# Patient Record
Sex: Male | Born: 1965 | Race: White | Hispanic: No | Marital: Married | Smoking: Never smoker
Health system: Southern US, Community
[De-identification: ages and names within clinical notes are randomized; demographics above are authoritative.]

## PROBLEM LIST (undated history)

## (undated) DIAGNOSIS — J45909 Unspecified asthma, uncomplicated: Secondary | ICD-10-CM

## (undated) DIAGNOSIS — C801 Malignant (primary) neoplasm, unspecified: Secondary | ICD-10-CM

## (undated) DIAGNOSIS — F419 Anxiety disorder, unspecified: Secondary | ICD-10-CM

## (undated) DIAGNOSIS — T7840XA Allergy, unspecified, initial encounter: Secondary | ICD-10-CM

## (undated) DIAGNOSIS — G43909 Migraine, unspecified, not intractable, without status migrainosus: Secondary | ICD-10-CM

## (undated) HISTORY — PX: SKIN CANCER EXCISION: SHX779

## (undated) HISTORY — DX: Anxiety disorder, unspecified: F41.9

## (undated) HISTORY — PX: KNEE SURGERY: SHX244

## (undated) HISTORY — DX: Unspecified asthma, uncomplicated: J45.909

## (undated) HISTORY — DX: Allergy, unspecified, initial encounter: T78.40XA

## (undated) HISTORY — PX: POLYPECTOMY: SHX149

## (undated) HISTORY — PX: OTHER SURGICAL HISTORY: SHX169

## (undated) HISTORY — DX: Migraine, unspecified, not intractable, without status migrainosus: G43.909

## (undated) HISTORY — DX: Malignant (primary) neoplasm, unspecified: C80.1

---

## 2002-04-16 HISTORY — PX: SHOULDER ARTHROSCOPY W/ LABRAL REPAIR: SHX2399

## 2008-04-16 HISTORY — PX: VASECTOMY: SHX75

## 2008-12-08 ENCOUNTER — Ambulatory Visit: Payer: Self-pay | Admitting: Sports Medicine

## 2008-12-08 ENCOUNTER — Encounter: Admission: RE | Admit: 2008-12-08 | Discharge: 2008-12-08 | Payer: Self-pay | Admitting: Sports Medicine

## 2008-12-08 DIAGNOSIS — M79609 Pain in unspecified limb: Secondary | ICD-10-CM

## 2009-08-26 ENCOUNTER — Ambulatory Visit: Payer: Self-pay | Admitting: Family Medicine

## 2009-08-26 DIAGNOSIS — R51 Headache: Secondary | ICD-10-CM

## 2009-08-26 DIAGNOSIS — M7650 Patellar tendinitis, unspecified knee: Secondary | ICD-10-CM

## 2009-08-26 DIAGNOSIS — J45909 Unspecified asthma, uncomplicated: Secondary | ICD-10-CM | POA: Insufficient documentation

## 2009-08-26 DIAGNOSIS — G43909 Migraine, unspecified, not intractable, without status migrainosus: Secondary | ICD-10-CM

## 2009-08-26 DIAGNOSIS — R519 Headache, unspecified: Secondary | ICD-10-CM | POA: Insufficient documentation

## 2009-08-26 DIAGNOSIS — J309 Allergic rhinitis, unspecified: Secondary | ICD-10-CM | POA: Insufficient documentation

## 2009-08-26 HISTORY — DX: Migraine, unspecified, not intractable, without status migrainosus: G43.909

## 2009-08-26 HISTORY — DX: Unspecified asthma, uncomplicated: J45.909

## 2009-08-30 ENCOUNTER — Ambulatory Visit: Payer: Self-pay | Admitting: Family Medicine

## 2009-09-30 ENCOUNTER — Ambulatory Visit: Payer: Self-pay | Admitting: Family Medicine

## 2009-09-30 LAB — CONVERTED CEMR LAB
ALT: 16 units/L (ref 0–53)
Basophils Absolute: 0 10*3/uL (ref 0.0–0.1)
Basophils Relative: 0.3 % (ref 0.0–3.0)
CO2: 31 meq/L (ref 19–32)
Calcium: 9.1 mg/dL (ref 8.4–10.5)
Chloride: 104 meq/L (ref 96–112)
Direct LDL: 150.8 mg/dL
Eosinophils Relative: 2.9 % (ref 0.0–5.0)
GFR calc non Af Amer: 87.42 mL/min (ref >60)
Glucose, Urine, Semiquant: NEGATIVE
HCT: 43.9 % (ref 39.0–52.0)
Hemoglobin: 14.6 g/dL (ref 13.0–17.0)
Ketones, urine, test strip: NEGATIVE
MCHC: 33.4 g/dL (ref 30.0–36.0)
Monocytes Absolute: 0.8 10*3/uL (ref 0.1–1.0)
Monocytes Relative: 10.5 % (ref 3.0–12.0)
Neutro Abs: 4.8 10*3/uL (ref 1.4–7.7)
Neutrophils Relative %: 64.8 % (ref 43.0–77.0)
Nitrite: NEGATIVE
Platelets: 189 10*3/uL (ref 150.0–400.0)
Protein, U semiquant: NEGATIVE
RBC: 4.82 M/uL (ref 4.22–5.81)
Specific Gravity, Urine: 1.015
Total CHOL/HDL Ratio: 5
Total Protein: 7.1 g/dL (ref 6.0–8.3)
Triglycerides: 128 mg/dL (ref 0.0–149.0)
VLDL: 25.6 mg/dL (ref 0.0–40.0)
WBC Urine, dipstick: NEGATIVE

## 2009-10-07 ENCOUNTER — Ambulatory Visit: Payer: Self-pay | Admitting: Family Medicine

## 2010-05-16 NOTE — Assessment & Plan Note (Signed)
Summary: CPX/NJR   Vital Signs:  Patient profile:   45 year old male Height:      73.25 inches Weight:      198 pounds Temp:     98.4 degrees F oral Pulse rate:   60 / minute Pulse rhythm:   regular Resp:     12 per minute BP sitting:   102 / 70  (left arm) Cuff size:   regular  Vitals Entered By: Sid Falcon LPN (October 07, 2009 11:35 AM) CC: CPX   History of Present Illness: Here for CPE.  tetanus last year.  Pos FH of prostate ca in father and MGF. Exercises regularly. Generally feels well.   Allergies (verified): No Known Drug Allergies  Past History:  Past Medical History: Last updated: 08/26/2009 Allergies, hayfever Asthma, as a child Headache/Migraines  Family History: Last updated: 08/26/2009 Family History of Arthritis Family History of Prostate CA, father and grandfather Lupus, Kidney diseasematernal grandmother  Social History: Last updated: 08/26/2009 Occupation:  Designer, jewellery Married Never Smoked Alcohol use-yes Regular exercise-yes  Risk Factors: Exercise: yes (08/26/2009)  Risk Factors: Smoking Status: never (08/26/2009)  Past Surgical History: Shoiulder repair labrum tear 2004 Vasecotomy 2010 Peridontal surgery 2009-2010 PMH-FH-SH reviewed for relevance  Review of Systems  The patient denies anorexia, fever, weight loss, weight gain, vision loss, decreased hearing, hoarseness, chest pain, syncope, dyspnea on exertion, peripheral edema, prolonged cough, headaches, hemoptysis, abdominal pain, melena, hematochezia, severe indigestion/heartburn, hematuria, incontinence, genital sores, muscle weakness, suspicious skin lesions, transient blindness, difficulty walking, depression, unusual weight change, abnormal bleeding, enlarged lymph nodes, and testicular masses.    Physical Exam  General:  Well-developed,well-nourished,in no acute distress; alert,appropriate and cooperative throughout examination Eyes:  No corneal or conjunctival  inflammation noted. EOMI. Perrla. Funduscopic exam benign, without hemorrhages, exudates or papilledema. Vision grossly normal. Ears:  External ear exam shows no significant lesions or deformities.  Otoscopic examination reveals clear canals, tympanic membranes are intact bilaterally without bulging, retraction, inflammation or discharge. Hearing is grossly normal bilaterally. Mouth:  Oral mucosa and oropharynx without lesions or exudates.  Teeth in good repair. Neck:  No deformities, masses, or tenderness noted. Lungs:  Normal respiratory effort, chest expands symmetrically. Lungs are clear to auscultation, no crackles or wheezes. Heart:  Normal rate and regular rhythm. S1 and S2 normal without gallop, murmur, click, rub or other extra sounds. Abdomen:  Bowel sounds positive,abdomen soft and non-tender without masses, organomegaly or hernias noted. Rectal:  No external abnormalities noted. Normal sphincter tone. No rectal masses or tenderness. Prostate:  Prostate gland firm and smooth, no enlargement, nodularity, tenderness, mass, asymmetry or induration. Extremities:  abrasion r elbow. Mild tender r patellar tendon. Neurologic:  No cranial nerve deficits noted. Station and gait are normal. Plantar reflexes are down-going bilaterally. DTRs are symmetrical throughout. Sensory, motor and coordinative functions appear intact. Skin:  no rashes and no suspicious lesions.   Cervical Nodes:  No lymphadenopathy noted Psych:  Cognition and judgment appear intact. Alert and cooperative with normal attention span and concentration. No apparent delusions, illusions, hallucinations   Impression & Recommendations:  Problem # 1:  Preventive Health Care (ICD-V70.0) add PSA with FH.  Other labs reviewed and no signif abnormality.  cont regulare exercise.  Complete Medication List: 1)  Imitrex 100 Mg Tabs (Sumatriptan succinate) .... One at onset of migraine and may repeat one in 2 hours as needed max 2 in 24  hours.  Other Orders: TLB-PSA (Prostate Specific Antigen) (84153-PSA)  Anticoagulation Management Assessment/Plan:  Patient Instructions: 1)  Continue to ice knee after exercise. 2)  Pennsaid 10 drops to knee three times daily.   Preventive Care Screening  Last Tetanus Booster:    Date:  08/17/2008    Results:  Tdap

## 2010-05-16 NOTE — Assessment & Plan Note (Signed)
Summary: NEW PT TO [facility]/TO EST/CJR   Vital Signs:  Patient profile:   45 year old male Height:      73.25 inches Weight:      197 pounds BMI:     25.91 Temp:     98.0 degrees F oral Pulse rate:   60 / minute Pulse rhythm:   regular Resp:     12 per minute BP sitting:   120 / 78  (left arm) Cuff size:   regular  Vitals Entered By: Sid Falcon LPN (Aug 26, 2009 11:25 AM)  Nutrition Counseling: Patient's BMI is greater than 25 and therefore counseled on weight management options.  History of Present Illness: New patient to establish care.  Past history migraine headaches. Recently using Excedrin which helps but usually has to take several tablets. Previously treated with Imitrex which worked well. Also prior history of mildly elevated lipids.  Acute problem bilateral anterior knee pain below the patella left greater than right. Plays tennis frequently. No weakness. No effusion. No injury.  Acute problem 2 week history right lower leg soreness. Noticed a swollen area just above the ankle. No injury. No limp with walking. No appetite or weight changes.  Past medical history reviewed. Surgeries as indicated. Family history signif for mother osteoarthritis. Father had prostate cancer. Grandmother with lupus  Patient married. Nonsmoker. Business executive. Occasional alcohol use.  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 Alcohol use-yes Regular exercise-yes  Risk Factors: Exercise: yes (08/26/2009)  Risk Factors: Smoking Status: never  (08/26/2009)  Past Medical History: Allergies, hayfever Asthma, as a child Headache/Migraines  Past Surgical History: Shoiulder repain 2004 Vasecotomy 2010 Peridontal surgery 2009-2010  Family History: Family History of Arthritis Family History of Prostate CA, father and grandfather Lupus, Kidney diseasematernal grandmother  Social History: Occupation:  Designer, 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    Physical Exam  General:  Well-developed,well-nourished,in no acute distress; alert,appropriate and cooperative throughout examination Head:  Normocephalic and atraumatic without obvious abnormalities. No apparent alopecia or balding. Ears:  External ear exam shows no significant lesions or deformities.  Otoscopic examination reveals clear canals, tympanic membranes are intact bilaterally without bulging, retraction, inflammation or discharge. Hearing is grossly normal bilaterally. Mouth:  Oral mucosa and oropharynx without lesions or exudates.  Teeth in good repair. Neck:  No deformities, masses, or tenderness noted. Lungs:  Normal respiratory effort, chest expands symmetrically. Lungs are clear to auscultation, no crackles or wheezes. Heart:  Normal rate and regular rhythm. S1 and S2 normal without gallop, murmur, click, rub or other extra sounds. Abdomen:  Bowel sounds positive,abdomen soft and non-tender without masses, organomegaly or hernias noted. Extremities:  right lower leg about 3 cm above the medial malleolus he has area of swelling which is firm to palpation and appears to be continuous with the  bone approx 2 cm area. No  overlying skin changes. No soft tissue swelling. Minimally tender to palpation. No right inguinal adenopathy Neurologic:  full strength lower extremities.   Impression & Recommendations:  Problem # 1:  MIGRAINE HEADACHE (ICD-346.90)  refill Imitrex which has worked well in the past  His updated medication list for this problem includes:    Imitrex 100 Mg Tabs (Sumatriptan succinate) ..... One at onset of migraine and may repeat one in 2 hours as needed max 2 in 24 hours.  Problem # 2:  LEG PAIN (ICD-729.5)  patient has bony prominence right leg. Start with plain x-rays.  Orders: T-Tibia Right (73590TC)  Problem # 3:  PATELLAR TENDINITIS (ICD-726.64) regular icing and discussed use of patellar tendon strap.  Complete Medication List: 1)  Imitrex 100 Mg Tabs (Sumatriptan succinate) .... One at onset of migraine and may repeat one in 2 hours as needed max 2 in 24 hours.  Patient Instructions: 1)  schedule complete physical examination Prescriptions: IMITREX 100 MG TABS (SUMATRIPTAN SUCCINATE) one at onset of migraine and may repeat one in 2 hours as needed max 2 in 24 hours.  #9 x 11   Entered and Authorized by:   Evelena Peat MD   Signed by:   Evelena Peat MD on 08/26/2009   Method used:   Electronically to        [facility]  [place] [address] (retail)       [address]       [place], [place]  [address]       Ph: [phone] or [phone]       Fax: [phone]   RxID:   [id number]

## 2010-05-26 ENCOUNTER — Encounter: Payer: Self-pay | Admitting: Family Medicine

## 2010-05-26 ENCOUNTER — Ambulatory Visit (INDEPENDENT_AMBULATORY_CARE_PROVIDER_SITE_OTHER): Payer: 59 | Admitting: Family Medicine

## 2010-05-26 VITALS — BP 110/80 | Temp 98.0°F | Ht 73.25 in | Wt 206.0 lb

## 2010-05-26 DIAGNOSIS — J209 Acute bronchitis, unspecified: Secondary | ICD-10-CM

## 2010-05-26 MED ORDER — AZITHROMYCIN 250 MG PO TABS: ORAL_TABLET | ORAL | Status: AC

## 2010-05-26 NOTE — Patient Instructions (Signed)
Drink plenty of fluids Consider mucinex 2 twice daily. Be in touch in one to two weeks if no better.

## 2010-05-26 NOTE — Progress Notes (Signed)
  Subjective:    Patient ID: Cody Arias, male    DOB: October 27, 1965, 45 y.o.   MRN: [id number]  HPI  Patient is seen with decreased voice and laryngitis symptoms off and on for one month. Particularly worse in the past few days. Dry cough. No fever. Denies GERD symptoms. No facial pain. Has recently had some cough productive of brown to green sputum. No hemoptysis. Nonsmoker. Denies dyspnea.   Review of Systems  as per history of present illness    Objective:   Physical Exam     Patient is alert and healthy in appearance  Oropharynx is moist and clear  Neck is supple no adenopathy  Chest clear to auscultation  Heart regular rhythm and rate with no murmur    Assessment & Plan:   Acute laryngitis/bronchitis   Possible viral etiology but given duration of symptoms start Zithromax. Plenty of fluids and Mucinex 2 tablets twice daily. Consider ENT referral for a better one to 2 weeks

## 2010-08-07 ENCOUNTER — Encounter: Payer: Self-pay | Admitting: *Deleted

## 2013-05-29 ENCOUNTER — Encounter: Payer: Self-pay | Admitting: Family Medicine

## 2013-05-29 ENCOUNTER — Ambulatory Visit (INDEPENDENT_AMBULATORY_CARE_PROVIDER_SITE_OTHER): Payer: BC Managed Care – PPO | Admitting: Family Medicine

## 2013-05-29 VITALS — BP 118/72 | HR 91 | Temp 97.5°F | Ht 73.25 in | Wt 190.0 lb

## 2013-05-29 DIAGNOSIS — R7989 Other specified abnormal findings of blood chemistry: Secondary | ICD-10-CM

## 2013-05-29 DIAGNOSIS — F524 Premature ejaculation: Secondary | ICD-10-CM

## 2013-05-29 DIAGNOSIS — Z0000 Encounter for general adult medical examination without abnormal findings: Secondary | ICD-10-CM

## 2013-05-29 DIAGNOSIS — F418 Other specified anxiety disorders: Secondary | ICD-10-CM

## 2013-05-29 DIAGNOSIS — F411 Generalized anxiety disorder: Secondary | ICD-10-CM

## 2013-05-29 LAB — TSH: TSH: 1.98 u[IU]/mL (ref 0.35–5.50)

## 2013-05-29 LAB — CBC WITH DIFFERENTIAL/PLATELET
BASOS ABS: 0 10*3/uL (ref 0.0–0.1)
Basophils Relative: 0.6 % (ref 0.0–3.0)
EOS ABS: 0.3 10*3/uL (ref 0.0–0.7)
EOS PCT: 4.8 % (ref 0.0–5.0)
HCT: 46 % (ref 39.0–52.0)
Hemoglobin: 14.9 g/dL (ref 13.0–17.0)
LYMPHS ABS: 1.5 10*3/uL (ref 0.7–4.0)
Lymphocytes Relative: 23 % (ref 12.0–46.0)
MCHC: 32.5 g/dL (ref 30.0–36.0)
MCV: 92.4 fl (ref 78.0–100.0)
Monocytes Absolute: 0.7 10*3/uL (ref 0.1–1.0)
Monocytes Relative: 11.3 % (ref 3.0–12.0)
Neutro Abs: 3.9 10*3/uL (ref 1.4–7.7)
Neutrophils Relative %: 60.3 % (ref 43.0–77.0)
Platelets: 206 10*3/uL (ref 150.0–400.0)
RBC: 4.98 Mil/uL (ref 4.22–5.81)
RDW: 14.7 % — ABNORMAL HIGH (ref 11.5–14.6)
WBC: 6.5 10*3/uL (ref 4.5–10.5)

## 2013-05-29 LAB — HEPATIC FUNCTION PANEL
ALBUMIN: 4 g/dL (ref 3.5–5.2)
ALT: 18 U/L (ref 0–53)
AST: 26 U/L (ref 0–37)
Alkaline Phosphatase: 38 U/L — ABNORMAL LOW (ref 39–117)
Bilirubin, Direct: 0.1 mg/dL (ref 0.0–0.3)
TOTAL PROTEIN: 6.9 g/dL (ref 6.0–8.3)
Total Bilirubin: 1.4 mg/dL — ABNORMAL HIGH (ref 0.3–1.2)

## 2013-05-29 LAB — LIPID PANEL
Cholesterol: 206 mg/dL — ABNORMAL HIGH (ref 0–200)
HDL: 56.7 mg/dL (ref >39.00)
TRIGLYCERIDES: 78 mg/dL (ref 0.0–149.0)
Total CHOL/HDL Ratio: 4
VLDL: 15.6 mg/dL (ref 0.0–40.0)

## 2013-05-29 LAB — BASIC METABOLIC PANEL
BUN: 17 mg/dL (ref 6–23)
CALCIUM: 9.8 mg/dL (ref 8.4–10.5)
CHLORIDE: 104 meq/L (ref 96–112)
CO2: 31 meq/L (ref 19–32)
Creatinine, Ser: 1 mg/dL (ref 0.4–1.5)
GFR: 80.37 mL/min (ref >60.00)
Glucose, Bld: 77 mg/dL (ref 70–99)
Potassium: 5.7 mEq/L — ABNORMAL HIGH (ref 3.5–5.1)
Sodium: 141 mEq/L (ref 135–145)

## 2013-05-29 LAB — LDL CHOLESTEROL, DIRECT: LDL DIRECT: 140.9 mg/dL

## 2013-05-29 LAB — PSA: PSA: 0.6 ng/mL (ref 0.10–4.00)

## 2013-05-29 MED ORDER — PROPRANOLOL HCL 10 MG PO TABS: ORAL_TABLET | ORAL | Status: DC

## 2013-05-29 MED ORDER — FLUOXETINE HCL 20 MG PO TABS: 20.0000 mg | ORAL_TABLET | Freq: Every day | ORAL | Status: DC

## 2013-05-29 NOTE — Patient Instructions (Addendum)
We will call you with colonoscopy referral.   Propranolol take one about one hour prior to presentation.

## 2013-05-29 NOTE — Progress Notes (Signed)
   Subjective:    Patient ID: Cody Arias, male    DOB: 1965/08/14, 48 y.o.   MRN: [id number]  HPI Patient here for complete physical. Generally very healthy. He takes no regular medications. Rare migraines and takes Imitrex for that. He will turn 48 later this year. Exercises almost daily frequently runs 3-4 miles per day. His father had prostate cancer. He has not been screened a few years. Tetanus is up-to-date.  He has acute issues of occasional premature ejaculation. No history of ED. He would like to explore possible treatment options Patient also has history of performance anxiety with presentations. He frequently has difficulties with voice and tremors. He would like to explore possible options. He does not have any active asthma issues.  Past Medical History  Diagnosis Date  . MIGRAINE HEADACHE 08/26/2009  . ASTHMA 08/26/2009  . Allergy     hayfever   Past Surgical History  Procedure Laterality Date  . Shoulder arthroscopy w/ labral repair  2004  . Vasectomy  2010  . Peridontal  2009, 2010    reports that he has never smoked. He does not have any smokeless tobacco history on file. He reports that he drinks alcohol. His drug history is not on file. family history includes Cancer in his father and paternal grandfather; Kidney disease in his maternal grandmother; Lupus in his maternal grandfather. No Known Allergies    Review of Systems  Constitutional: Negative for fever, activity change, appetite change and fatigue.  HENT: Negative for congestion, ear pain and trouble swallowing.   Eyes: Negative for pain and visual disturbance.  Respiratory: Negative for cough, shortness of breath and wheezing.   Cardiovascular: Negative for chest pain and palpitations.  Gastrointestinal: Negative for nausea, vomiting, abdominal pain, diarrhea, constipation, blood in stool, abdominal distention and rectal pain.  Genitourinary: Negative for dysuria, hematuria and testicular pain.    Musculoskeletal: Negative for arthralgias and joint swelling.  Skin: Negative for rash.  Neurological: Negative for dizziness, syncope and headaches.  Hematological: Negative for adenopathy.  Psychiatric/Behavioral: Negative for confusion and dysphoric mood.       Objective:   Physical Exam  Constitutional: He is oriented to person, place, and time. He appears well-developed and well-nourished. No distress.  HENT:  Head: Normocephalic and atraumatic.  Right Ear: External ear normal.  Left Ear: External ear normal.  Mouth/Throat: Oropharynx is clear and moist.  Eyes: Conjunctivae and EOM are normal. Pupils are equal, round, and reactive to light.  Neck: Normal range of motion. Neck supple. No thyromegaly present.  Cardiovascular: Normal rate, regular rhythm and normal heart sounds.   No murmur heard. Pulmonary/Chest: No respiratory distress. He has no wheezes. He has no rales.  Abdominal: Soft. Bowel sounds are normal. He exhibits no distension and no mass. There is no tenderness. There is no rebound and no guarding.  Genitourinary: Rectum normal and prostate normal.  Musculoskeletal: He exhibits no edema.  Lymphadenopathy:    He has no cervical adenopathy.  Neurological: He is alert and oriented to person, place, and time. He displays normal reflexes. No cranial nerve deficit.  Skin: No rash noted.  Psychiatric: He has a normal mood and affect.          Assessment & Plan:  #1 complete physical. Screening labs obtained including PSA. Set up screening colonoscopy #2 premature ejaculation. Trial of Prozac 20 mg daily. Review possible side effects #3 performance anxiety. Propranolol 10 mg take one hour prior to presentations

## 2013-05-29 NOTE — Progress Notes (Signed)
Pre visit review using our clinic review tool, if applicable. No additional management support is needed unless otherwise documented below in the visit note. 

## 2013-06-04 ENCOUNTER — Encounter: Payer: Self-pay | Admitting: Internal Medicine

## 2013-07-01 ENCOUNTER — Ambulatory Visit (AMBULATORY_SURGERY_CENTER): Payer: Self-pay | Admitting: *Deleted

## 2013-07-01 VITALS — Ht 74.0 in | Wt 180.0 lb

## 2013-07-01 DIAGNOSIS — Z1211 Encounter for screening for malignant neoplasm of colon: Secondary | ICD-10-CM

## 2013-07-01 MED ORDER — MOVIPREP 100 G PO SOLR
ORAL | Status: DC
Start: 2013-07-01 — End: 2013-07-15

## 2013-07-01 NOTE — Progress Notes (Signed)
Patient denies any allergies to eggs or soy. Patient denies any problems with anesthesia.  

## 2013-07-15 ENCOUNTER — Ambulatory Visit (AMBULATORY_SURGERY_CENTER): Payer: BC Managed Care – PPO | Admitting: Internal Medicine

## 2013-07-15 ENCOUNTER — Encounter: Payer: Self-pay | Admitting: Internal Medicine

## 2013-07-15 VITALS — BP 138/77 | HR 54 | Temp 97.8°F | Resp 12 | Ht 74.0 in | Wt 180.0 lb

## 2013-07-15 DIAGNOSIS — D126 Benign neoplasm of colon, unspecified: Secondary | ICD-10-CM

## 2013-07-15 DIAGNOSIS — Z1211 Encounter for screening for malignant neoplasm of colon: Secondary | ICD-10-CM

## 2013-07-15 HISTORY — PX: COLONOSCOPY: SHX174

## 2013-07-15 MED ORDER — SODIUM CHLORIDE 0.9 % IV SOLN: 500.0000 mL | INTRAVENOUS | Status: DC

## 2013-07-15 NOTE — Progress Notes (Signed)
Report to pacu rn, vss, bbs=clear 

## 2013-07-15 NOTE — Progress Notes (Signed)
Patient denies any allergies to eggs or soy. Patient denies any problems with anesthesia.  

## 2013-07-15 NOTE — Patient Instructions (Signed)
YOU HAD AN ENDOSCOPIC PROCEDURE TODAY AT THE [facility]: Refer to the procedure report that was given to you for any specific questions about what was found during the examination.  If the procedure report does not answer your questions, please call your gastroenterologist to clarify.  If you requested that your care partner not be given the details of your procedure findings, then the procedure report has been included in a sealed envelope for you to review at your convenience later.  YOU SHOULD EXPECT: Some feelings of bloating in the abdomen. Passage of more gas than usual.  Walking can help get rid of the air that was put into your GI tract during the procedure and reduce the bloating. If you had a lower endoscopy (such as a colonoscopy or flexible sigmoidoscopy) you may notice spotting of blood in your stool or on the toilet paper. If you underwent a bowel prep for your procedure, then you may not have a normal bowel movement for a few days.  DIET: Your first meal following the procedure should be a light meal and then it is ok to progress to your normal diet.  A half-sandwich or bowl of soup is an example of a good first meal.  Heavy or fried foods are harder to digest and may make you feel nauseous or bloated.  Likewise meals heavy in dairy and vegetables can cause extra gas to form and this can also increase the bloating.  Drink plenty of fluids but you should avoid alcoholic beverages for 24 hours.  ACTIVITY: Your care partner should take you home directly after the procedure.  You should plan to take it easy, moving slowly for the rest of the day.  You can resume normal activity the day after the procedure however you should NOT DRIVE or use heavy machinery for 24 hours (because of the sedation medicines used during the test).    SYMPTOMS TO REPORT IMMEDIATELY: A gastroenterologist can be reached at any hour.  During normal business hours, 8:30 AM to 5:00 PM Monday through Friday,  call [phone].  After hours and on weekends, please call the GI answering service at [phone] who will take a message and have the physician on call contact you.   Following lower endoscopy (colonoscopy or flexible sigmoidoscopy):  Excessive amounts of blood in the stool  Significant tenderness or worsening of abdominal pains  Swelling of the abdomen that is new, acute  Fever of 100F or higher  FOLLOW UP: If any biopsies were taken you will be contacted by phone or by letter within the next 1-3 weeks.  Call your gastroenterologist if you have not heard about the biopsies in 3 weeks.  Our staff will call the home number listed on your records the next business day following your procedure to check on you and address any questions or concerns that you may have at that time regarding the information given to you following your procedure. This is a courtesy call and so if there is no answer at the home number and we have not heard from you through the emergency physician on call, we will assume that you have returned to your regular daily activities without incident.  SIGNATURES/CONFIDENTIALITY: You and/or your care partner have signed paperwork which will be entered into your electronic medical record.  These signatures attest to the fact that that the information above on your After Visit Summary has been reviewed and is understood.  Full responsibility of the confidentiality of this   discharge information lies with you and/or your care-partner.  Continue your normal medications  Please read over handouts about polyps,  Diverticulosis and high fiber diets  Await pathology

## 2013-07-15 NOTE — Progress Notes (Signed)
Called to room to assist during endoscopic procedure.  Patient ID and intended procedure confirmed with present staff. Received instructions for my participation in the procedure from the performing physician.  

## 2013-07-15 NOTE — Op Note (Signed)
[facility]  [address] [place], [address]   COLONOSCOPY PROCEDURE REPORT  PATIENT: Cody Arias, Cody Arias  MR#: [id number] BIRTHDATE: 1965-09-09 , 22  yrs. old GENDER: Male ENDOSCOPIST: Jerene Bears, MD REFERRED OV:ZCHYI Elease Hashimoto, M.D. PROCEDURE DATE:  07/15/2013 PROCEDURE:   Colonoscopy with snare polypectomy First Screening Colonoscopy - Avg.  risk and is 50 yrs.  old or older Yes.  Prior Negative Screening - Now for repeat screening. N/A  History of Adenoma - Now for follow-up colonoscopy & has been > or = to 3 yrs.  N/A  Polyps Removed Today? Yes. ASA CLASS:   Class II INDICATIONS:average risk screening and first colonoscopy. MEDICATIONS: MAC sedation, administered by CRNA and propofol (Diprivan) 350mg  IV  DESCRIPTION OF PROCEDURE:   After the risks benefits and alternatives of the procedure were thoroughly explained, informed consent was obtained.  A digital rectal exam revealed no rectal mass.   The LB [id number]  endoscope was introduced through the anus and advanced to the cecum, which was identified by both the appendix and ileocecal valve. No adverse events experienced. The quality of the prep was good, using MoviPrep  The instrument was then slowly withdrawn as the colon was fully examined.  COLON FINDINGS: Two sessile polyps measuring 3-6 mm in size were found in the ascending colon and transverse colon.  Polypectomy was performed using cold snare.  All resections were complete and all polyp tissue was completely retrieved.   There was moderate diverticulosis noted in the sigmoid colon with associated muscular hypertrophy.  Retroflexed views revealed no abnormalities. The time to cecum=7 minutes 56 seconds.  Withdrawal time=10 minutes 31 seconds.  The scope was withdrawn and the procedure completed. COMPLICATIONS: There were no complications.  ENDOSCOPIC IMPRESSION: 1.   Two sessile polyps measuring 3-6 mm in size were found in the ascending  colon and transverse colon; Polypectomy was performed using cold snare 2.   There was moderate diverticulosis noted in the sigmoid colon  RECOMMENDATIONS: 1.  Await pathology results 2.  High fiber diet 3.  If the polyps removed today are proven to be adenomatous (pre-cancerous) polyps, you will need a repeat colonoscopy in 5 years.  Otherwise you should continue to follow colorectal cancer screening guidelines for "routine risk" patients with colonoscopy in 10 years.  You will receive a letter within 1-2 weeks with the results of your biopsy as well as final recommendations.  Please call my office if you have not received a letter after 3 weeks.   eSigned:  Jerene Bears, MD 07/15/2013 8:51 AM      cc: The Patient and Carolann Littler, MD

## 2013-07-16 ENCOUNTER — Telehealth: Payer: Self-pay

## 2013-07-16 NOTE — Telephone Encounter (Signed)
Left message on answering machine. 

## 2013-07-26 ENCOUNTER — Encounter: Payer: Self-pay | Admitting: Internal Medicine

## 2014-06-08 ENCOUNTER — Encounter: Payer: Self-pay | Admitting: Family Medicine

## 2014-06-18 ENCOUNTER — Encounter: Payer: Self-pay | Admitting: Family Medicine

## 2014-06-18 ENCOUNTER — Ambulatory Visit (INDEPENDENT_AMBULATORY_CARE_PROVIDER_SITE_OTHER): Payer: BLUE CROSS/BLUE SHIELD | Admitting: Family Medicine

## 2014-06-18 VITALS — BP 124/70 | HR 60 | Temp 97.7°F | Ht 73.0 in | Wt 191.0 lb

## 2014-06-18 DIAGNOSIS — Z0000 Encounter for general adult medical examination without abnormal findings: Secondary | ICD-10-CM

## 2014-06-18 LAB — HEPATIC FUNCTION PANEL
ALT: 14 U/L (ref 0–53)
AST: 23 U/L (ref 0–37)
Albumin: 4.2 g/dL (ref 3.5–5.2)
Alkaline Phosphatase: 40 U/L (ref 39–117)
BILIRUBIN DIRECT: 0.2 mg/dL (ref 0.0–0.3)
Total Bilirubin: 1 mg/dL (ref 0.2–1.2)
Total Protein: 6.8 g/dL (ref 6.0–8.3)

## 2014-06-18 LAB — BASIC METABOLIC PANEL
BUN: 17 mg/dL (ref 6–23)
CO2: 33 mEq/L — ABNORMAL HIGH (ref 19–32)
CREATININE: 1.14 mg/dL (ref 0.40–1.50)
Calcium: 9.4 mg/dL (ref 8.4–10.5)
Chloride: 103 mEq/L (ref 96–112)
GFR: 71.98 mL/min (ref >60.00)
GLUCOSE: 80 mg/dL (ref 70–99)
POTASSIUM: 5.2 meq/L — AB (ref 3.5–5.1)
Sodium: 138 mEq/L (ref 135–145)

## 2014-06-18 LAB — LIPID PANEL
CHOLESTEROL: 207 mg/dL — AB (ref 0–200)
HDL: 54.7 mg/dL (ref >39.00)
LDL Cholesterol: 133 mg/dL — ABNORMAL HIGH (ref 0–99)
NonHDL: 152.3
TRIGLYCERIDES: 99 mg/dL (ref 0.0–149.0)
Total CHOL/HDL Ratio: 4
VLDL: 19.8 mg/dL (ref 0.0–40.0)

## 2014-06-18 LAB — CBC WITH DIFFERENTIAL/PLATELET
BASOS ABS: 0 10*3/uL (ref 0.0–0.1)
Basophils Relative: 0.6 % (ref 0.0–3.0)
EOS ABS: 0.2 10*3/uL (ref 0.0–0.7)
Eosinophils Relative: 3.4 % (ref 0.0–5.0)
HCT: 44.4 % (ref 39.0–52.0)
HEMOGLOBIN: 15 g/dL (ref 13.0–17.0)
LYMPHS PCT: 22.8 % (ref 12.0–46.0)
Lymphs Abs: 1.6 10*3/uL (ref 0.7–4.0)
MCHC: 33.8 g/dL (ref 30.0–36.0)
MCV: 88.9 fl (ref 78.0–100.0)
MONOS PCT: 10.2 % (ref 3.0–12.0)
Monocytes Absolute: 0.7 10*3/uL (ref 0.1–1.0)
NEUTROS PCT: 63 % (ref 43.0–77.0)
Neutro Abs: 4.3 10*3/uL (ref 1.4–7.7)
Platelets: 207 10*3/uL (ref 150.0–400.0)
RBC: 5 Mil/uL (ref 4.22–5.81)
RDW: 14.1 % (ref 11.5–15.5)
WBC: 6.9 10*3/uL (ref 4.0–10.5)

## 2014-06-18 LAB — TSH: TSH: 2 u[IU]/mL (ref 0.35–4.50)

## 2014-06-18 LAB — PSA: PSA: 0.56 ng/mL (ref 0.10–4.00)

## 2014-06-18 MED ORDER — SUMATRIPTAN SUCCINATE 100 MG PO TABS: 100.0000 mg | ORAL_TABLET | ORAL | Status: DC | PRN

## 2014-06-18 NOTE — Progress Notes (Signed)
Pre visit review using our clinic review tool, if applicable. No additional management support is needed unless otherwise documented below in the visit note. 

## 2014-06-18 NOTE — Progress Notes (Signed)
   Subjective:    Patient ID: Cody Arias, male    DOB: September 16, 1965, 49 y.o.   MRN: [id number]  HPI   Patient seen for complete physical. Generally very healthy. He has migraine headaches infrequently. He's had some problems with premature ejaculation. We have prescribed Prozac but he had some nausea at the 20 mg dose. He never tried taking a lower dosage. He had colonoscopy last year with 2 benign polyps. Recommended five-year follow-up. Tetanus up-to-date. He declines flu vaccine. But his father and grandfather had prostate cancer. His father died in his early 8s from prostate cancer. Patient is nonsmoker. He runs 3 miles several days per week. He has had previous history of basal cell skin cancer. He sees dermatologist yearly.  Past Medical History  Diagnosis Date  . MIGRAINE HEADACHE 08/26/2009  . Allergy     hayfever  . ASTHMA 08/26/2009    as child   Past Surgical History  Procedure Laterality Date  . Shoulder arthroscopy w/ labral repair  2004  . Vasectomy  2010  . Peridontal  2009, 2010  . Skin cancer excision      basil cell x 4 (chest,back,and leg)    reports that he has never smoked. He has never used smokeless tobacco. He reports that he drinks about 1.8 oz of alcohol per week. He reports that he does not use illicit drugs. family history includes Kidney disease in his maternal grandmother; Lupus in his maternal grandfather; Prostate cancer in his father and paternal grandfather. There is no history of Colon cancer. No Known Allergies    Review of Systems  Constitutional: Negative for fever, activity change, appetite change and fatigue.  HENT: Negative for congestion, ear pain and trouble swallowing.   Eyes: Negative for pain and visual disturbance.  Respiratory: Negative for cough, shortness of breath and wheezing.   Cardiovascular: Negative for chest pain and palpitations.  Gastrointestinal: Negative for nausea, vomiting, abdominal pain, diarrhea, constipation, blood  in stool, abdominal distention and rectal pain.  Genitourinary: Negative for dysuria, hematuria and testicular pain.  Musculoskeletal: Negative for joint swelling and arthralgias.  Skin: Negative for rash.  Neurological: Negative for dizziness, syncope and headaches.  Hematological: Negative for adenopathy.  Psychiatric/Behavioral: Negative for confusion and dysphoric mood.       Objective:   Physical Exam  Constitutional: He is oriented to person, place, and time. He appears well-developed and well-nourished. No distress.  HENT:  Head: Normocephalic and atraumatic.  Right Ear: External ear normal.  Left Ear: External ear normal.  Mouth/Throat: Oropharynx is clear and moist.  Eyes: Conjunctivae and EOM are normal. Pupils are equal, round, and reactive to light.  Neck: Normal range of motion. Neck supple. No thyromegaly present.  Cardiovascular: Normal rate, regular rhythm and normal heart sounds.   No murmur heard. Pulmonary/Chest: No respiratory distress. He has no wheezes. He has no rales.  Abdominal: Soft. Bowel sounds are normal. He exhibits no distension and no mass. There is no tenderness. There is no rebound and no guarding.  Musculoskeletal: He exhibits no edema.  Lymphadenopathy:    He has no cervical adenopathy.  Neurological: He is alert and oriented to person, place, and time. He displays normal reflexes. No cranial nerve deficit.  Skin: No rash noted.  Psychiatric: He has a normal mood and affect.          Assessment & Plan:  Complete physical. Check screening labs including PSA. Refills of Imitrex given. Repeat colonoscopy in 4 years

## 2015-06-29 ENCOUNTER — Ambulatory Visit
Admission: RE | Admit: 2015-06-29 | Discharge: 2015-06-29 | Disposition: A | Discharge status: Home / Self Care | Payer: 59 | Source: Ambulatory Visit | Attending: Chiropractic Medicine | Admitting: Chiropractic Medicine

## 2015-06-29 ENCOUNTER — Other Ambulatory Visit: Payer: Self-pay | Admitting: Chiropractic Medicine

## 2015-06-29 DIAGNOSIS — M25562 Pain in left knee: Secondary | ICD-10-CM

## 2015-07-14 DIAGNOSIS — S83209A Unspecified tear of unspecified meniscus, current injury, unspecified knee, initial encounter: Secondary | ICD-10-CM | POA: Insufficient documentation

## 2015-08-02 DIAGNOSIS — Z9889 Other specified postprocedural states: Secondary | ICD-10-CM | POA: Insufficient documentation

## 2015-10-19 ENCOUNTER — Ambulatory Visit (INDEPENDENT_AMBULATORY_CARE_PROVIDER_SITE_OTHER): Payer: 59 | Admitting: Family Medicine

## 2015-10-19 ENCOUNTER — Encounter: Payer: Self-pay | Admitting: Family Medicine

## 2015-10-19 VITALS — BP 100/70 | HR 67 | Temp 97.9°F | Resp 16 | Ht 73.0 in | Wt 194.0 lb

## 2015-10-19 DIAGNOSIS — Z0000 Encounter for general adult medical examination without abnormal findings: Secondary | ICD-10-CM | POA: Diagnosis not present

## 2015-10-19 LAB — HEPATIC FUNCTION PANEL
ALBUMIN: 3.8 g/dL (ref 3.5–5.2)
ALK PHOS: 39 U/L (ref 39–117)
ALT: 10 U/L (ref 0–53)
AST: 17 U/L (ref 0–37)
Bilirubin, Direct: 0.1 mg/dL (ref 0.0–0.3)
TOTAL PROTEIN: 6.4 g/dL (ref 6.0–8.3)
Total Bilirubin: 0.8 mg/dL (ref 0.2–1.2)

## 2015-10-19 LAB — CBC WITH DIFFERENTIAL/PLATELET
Basophils Absolute: 0 10*3/uL (ref 0.0–0.1)
Basophils Relative: 0.5 % (ref 0.0–3.0)
EOS PCT: 5.9 % — AB (ref 0.0–5.0)
Eosinophils Absolute: 0.4 10*3/uL (ref 0.0–0.7)
HCT: 41.5 % (ref 39.0–52.0)
Hemoglobin: 13.9 g/dL (ref 13.0–17.0)
LYMPHS ABS: 1.4 10*3/uL (ref 0.7–4.0)
Lymphocytes Relative: 21.5 % (ref 12.0–46.0)
MCHC: 33.4 g/dL (ref 30.0–36.0)
MCV: 89.2 fl (ref 78.0–100.0)
MONO ABS: 0.9 10*3/uL (ref 0.1–1.0)
Monocytes Relative: 13.9 % — ABNORMAL HIGH (ref 3.0–12.0)
NEUTROS ABS: 3.7 10*3/uL (ref 1.4–7.7)
NEUTROS PCT: 58.2 % (ref 43.0–77.0)
PLATELETS: 207 10*3/uL (ref 150.0–400.0)
RBC: 4.66 Mil/uL (ref 4.22–5.81)
RDW: 13.8 % (ref 11.5–15.5)
WBC: 6.3 10*3/uL (ref 4.0–10.5)

## 2015-10-19 LAB — LIPID PANEL
Cholesterol: 212 mg/dL — ABNORMAL HIGH (ref 0–200)
HDL: 51.3 mg/dL (ref >39.00)
LDL Cholesterol: 140 mg/dL — ABNORMAL HIGH (ref 0–99)
NonHDL: 160.65
TRIGLYCERIDES: 102 mg/dL (ref 0.0–149.0)
Total CHOL/HDL Ratio: 4
VLDL: 20.4 mg/dL (ref 0.0–40.0)

## 2015-10-19 LAB — BASIC METABOLIC PANEL
BUN: 16 mg/dL (ref 6–23)
CO2: 31 meq/L (ref 19–32)
Calcium: 9.1 mg/dL (ref 8.4–10.5)
Chloride: 104 mEq/L (ref 96–112)
Creatinine, Ser: 1.06 mg/dL (ref 0.40–1.50)
GFR: 77.88 mL/min (ref >60.00)
Glucose, Bld: 76 mg/dL (ref 70–99)
POTASSIUM: 4.7 meq/L (ref 3.5–5.1)
SODIUM: 139 meq/L (ref 135–145)

## 2015-10-19 LAB — TSH: TSH: 2.08 u[IU]/mL (ref 0.35–4.50)

## 2015-10-19 LAB — PSA: PSA: 0.54 ng/mL (ref 0.10–4.00)

## 2015-10-19 MED ORDER — SUMATRIPTAN SUCCINATE 100 MG PO TABS: 100.0000 mg | ORAL_TABLET | ORAL | Status: DC | PRN

## 2015-10-19 NOTE — Progress Notes (Signed)
Subjective:    Patient ID: Cody Arias, male    DOB: December 12, 1965, 50 y.o.   MRN: [id number]  HPI  Patient seen for complete physical He just had recent left knee surgery for meniscus repair. He's done well since then.  Exercises regularly. Tetanus up-to-date. Will need repeat colonoscopy in a couple of years His father died of prostate cancer complications in his [id number]. He has occasional migraines and takes Imitrex as needed. No specific risk factors for hepatitis C  Past Medical History  Diagnosis Date  . MIGRAINE HEADACHE 08/26/2009  . Allergy     hayfever  . ASTHMA 08/26/2009    as child   Past Surgical History  Procedure Laterality Date  . Shoulder arthroscopy w/ labral repair  2004  . Vasectomy  2010  . Peridontal  2009, 2010  . Skin cancer excision      basil cell x 4 (chest,back,and leg)  . Knee surgery Left Maniscus repair/ April 2017    reports that he has never smoked. He has never used smokeless tobacco. He reports that he drinks about 1.8 oz of alcohol per week. He reports that he does not use illicit drugs. family history includes Kidney disease in his maternal grandmother; Lupus in his maternal grandfather; Prostate cancer in his father and paternal grandfather. There is no history of Colon cancer. No Known Allergies   Review of Systems  Constitutional: Negative for fever, activity change, appetite change and fatigue.  HENT: Negative for congestion, ear pain and trouble swallowing.   Eyes: Negative for pain and visual disturbance.  Respiratory: Negative for cough, shortness of breath and wheezing.   Cardiovascular: Negative for chest pain and palpitations.  Gastrointestinal: Negative for nausea, vomiting, abdominal pain, diarrhea, constipation, blood in stool, abdominal distention and rectal pain.  Endocrine: Negative for polydipsia and polyuria.  Genitourinary: Negative for dysuria, hematuria and testicular pain.  Musculoskeletal: Negative for joint swelling  and arthralgias.  Skin: Negative for rash.  Neurological: Negative for dizziness, syncope and headaches.  Hematological: Negative for adenopathy.  Psychiatric/Behavioral: Negative for confusion and dysphoric mood.       Objective:   Physical Exam  Constitutional: He is oriented to person, place, and time. He appears well-developed and well-nourished. No distress.  HENT:  Head: Normocephalic and atraumatic.  Right Ear: External ear normal.  Left Ear: External ear normal.  Mouth/Throat: Oropharynx is clear and moist.  Eyes: Conjunctivae and EOM are normal. Pupils are equal, round, and reactive to light.  Neck: Normal range of motion. Neck supple. No thyromegaly present.  Cardiovascular: Normal rate, regular rhythm and normal heart sounds.   No murmur heard. Pulmonary/Chest: No respiratory distress. He has no wheezes. He has no rales.  Abdominal: Soft. Bowel sounds are normal. He exhibits no distension and no mass. There is no tenderness. There is no rebound and no guarding.  Musculoskeletal: He exhibits no edema.  Lymphadenopathy:    He has no cervical adenopathy.  Neurological: He is alert and oriented to person, place, and time. He displays normal reflexes. No cranial nerve deficit.  Skin:  He has small ulcer right external ear from recent skin biopsy. He had basal cell carcinoma and plans to get full excision in August  Psychiatric: He has a normal mood and affect.          Assessment & Plan:  Physical exam. Positive family history of prostate cancer as above. Obtain screening labs including PSA. Tetanus up-to-date. Repeat colonoscopy in a couple years.  Eulas Post MD [facility]

## 2016-06-26 ENCOUNTER — Telehealth: Payer: Self-pay | Admitting: Family Medicine

## 2016-06-26 NOTE — Telephone Encounter (Signed)
I would suggest Cody Arias- here in our clinic

## 2016-06-26 NOTE — Telephone Encounter (Signed)
° ° °  Pt call to ask for a referral to a therapist for anxiety.

## 2016-06-26 NOTE — Telephone Encounter (Signed)
Left message on machine for patient to return our call 

## 2016-06-27 NOTE — Telephone Encounter (Signed)
Patient is aware 

## 2016-07-06 DIAGNOSIS — M9903 Segmental and somatic dysfunction of lumbar region: Secondary | ICD-10-CM | POA: Diagnosis not present

## 2016-07-06 DIAGNOSIS — M9902 Segmental and somatic dysfunction of thoracic region: Secondary | ICD-10-CM | POA: Diagnosis not present

## 2016-07-06 DIAGNOSIS — M9901 Segmental and somatic dysfunction of cervical region: Secondary | ICD-10-CM | POA: Diagnosis not present

## 2016-07-24 ENCOUNTER — Ambulatory Visit: Payer: 59 | Admitting: Psychology

## 2016-07-27 DIAGNOSIS — M9902 Segmental and somatic dysfunction of thoracic region: Secondary | ICD-10-CM | POA: Diagnosis not present

## 2016-07-27 DIAGNOSIS — M9901 Segmental and somatic dysfunction of cervical region: Secondary | ICD-10-CM | POA: Diagnosis not present

## 2016-07-27 DIAGNOSIS — M9903 Segmental and somatic dysfunction of lumbar region: Secondary | ICD-10-CM | POA: Diagnosis not present

## 2016-08-10 DIAGNOSIS — M9902 Segmental and somatic dysfunction of thoracic region: Secondary | ICD-10-CM | POA: Diagnosis not present

## 2016-08-10 DIAGNOSIS — M9903 Segmental and somatic dysfunction of lumbar region: Secondary | ICD-10-CM | POA: Diagnosis not present

## 2016-08-10 DIAGNOSIS — M9901 Segmental and somatic dysfunction of cervical region: Secondary | ICD-10-CM | POA: Diagnosis not present

## 2016-08-16 ENCOUNTER — Ambulatory Visit (INDEPENDENT_AMBULATORY_CARE_PROVIDER_SITE_OTHER): Payer: 59 | Admitting: Psychology

## 2016-08-16 DIAGNOSIS — F410 Panic disorder [episodic paroxysmal anxiety] without agoraphobia: Secondary | ICD-10-CM | POA: Diagnosis not present

## 2016-08-24 DIAGNOSIS — M9901 Segmental and somatic dysfunction of cervical region: Secondary | ICD-10-CM | POA: Diagnosis not present

## 2016-08-24 DIAGNOSIS — M9903 Segmental and somatic dysfunction of lumbar region: Secondary | ICD-10-CM | POA: Diagnosis not present

## 2016-08-24 DIAGNOSIS — M9902 Segmental and somatic dysfunction of thoracic region: Secondary | ICD-10-CM | POA: Diagnosis not present

## 2016-09-13 ENCOUNTER — Ambulatory Visit (INDEPENDENT_AMBULATORY_CARE_PROVIDER_SITE_OTHER): Payer: 59 | Admitting: Psychology

## 2016-09-13 DIAGNOSIS — F410 Panic disorder [episodic paroxysmal anxiety] without agoraphobia: Secondary | ICD-10-CM

## 2016-09-14 DIAGNOSIS — M9902 Segmental and somatic dysfunction of thoracic region: Secondary | ICD-10-CM | POA: Diagnosis not present

## 2016-09-14 DIAGNOSIS — M9901 Segmental and somatic dysfunction of cervical region: Secondary | ICD-10-CM | POA: Diagnosis not present

## 2016-09-14 DIAGNOSIS — M9903 Segmental and somatic dysfunction of lumbar region: Secondary | ICD-10-CM | POA: Diagnosis not present

## 2016-09-28 DIAGNOSIS — M9903 Segmental and somatic dysfunction of lumbar region: Secondary | ICD-10-CM | POA: Diagnosis not present

## 2016-09-28 DIAGNOSIS — M9902 Segmental and somatic dysfunction of thoracic region: Secondary | ICD-10-CM | POA: Diagnosis not present

## 2016-09-28 DIAGNOSIS — M9901 Segmental and somatic dysfunction of cervical region: Secondary | ICD-10-CM | POA: Diagnosis not present

## 2016-10-08 ENCOUNTER — Ambulatory Visit (INDEPENDENT_AMBULATORY_CARE_PROVIDER_SITE_OTHER): Payer: 59 | Admitting: Psychology

## 2016-10-08 DIAGNOSIS — F410 Panic disorder [episodic paroxysmal anxiety] without agoraphobia: Secondary | ICD-10-CM | POA: Diagnosis not present

## 2016-10-11 DIAGNOSIS — L570 Actinic keratosis: Secondary | ICD-10-CM | POA: Diagnosis not present

## 2016-10-11 DIAGNOSIS — L814 Other melanin hyperpigmentation: Secondary | ICD-10-CM | POA: Diagnosis not present

## 2016-10-11 DIAGNOSIS — D1801 Hemangioma of skin and subcutaneous tissue: Secondary | ICD-10-CM | POA: Diagnosis not present

## 2016-10-11 DIAGNOSIS — Z85828 Personal history of other malignant neoplasm of skin: Secondary | ICD-10-CM | POA: Diagnosis not present

## 2016-10-12 DIAGNOSIS — M9903 Segmental and somatic dysfunction of lumbar region: Secondary | ICD-10-CM | POA: Diagnosis not present

## 2016-10-12 DIAGNOSIS — M9901 Segmental and somatic dysfunction of cervical region: Secondary | ICD-10-CM | POA: Diagnosis not present

## 2016-10-12 DIAGNOSIS — M9902 Segmental and somatic dysfunction of thoracic region: Secondary | ICD-10-CM | POA: Diagnosis not present

## 2016-11-02 DIAGNOSIS — M9902 Segmental and somatic dysfunction of thoracic region: Secondary | ICD-10-CM | POA: Diagnosis not present

## 2016-11-02 DIAGNOSIS — M9903 Segmental and somatic dysfunction of lumbar region: Secondary | ICD-10-CM | POA: Diagnosis not present

## 2016-11-02 DIAGNOSIS — M9901 Segmental and somatic dysfunction of cervical region: Secondary | ICD-10-CM | POA: Diagnosis not present

## 2016-11-08 ENCOUNTER — Ambulatory Visit (INDEPENDENT_AMBULATORY_CARE_PROVIDER_SITE_OTHER): Payer: 59 | Admitting: Psychology

## 2016-11-08 DIAGNOSIS — F410 Panic disorder [episodic paroxysmal anxiety] without agoraphobia: Secondary | ICD-10-CM

## 2016-11-23 DIAGNOSIS — M9903 Segmental and somatic dysfunction of lumbar region: Secondary | ICD-10-CM | POA: Diagnosis not present

## 2016-11-23 DIAGNOSIS — M9902 Segmental and somatic dysfunction of thoracic region: Secondary | ICD-10-CM | POA: Diagnosis not present

## 2016-11-23 DIAGNOSIS — M9901 Segmental and somatic dysfunction of cervical region: Secondary | ICD-10-CM | POA: Diagnosis not present

## 2016-11-30 DIAGNOSIS — M9902 Segmental and somatic dysfunction of thoracic region: Secondary | ICD-10-CM | POA: Diagnosis not present

## 2016-11-30 DIAGNOSIS — M9903 Segmental and somatic dysfunction of lumbar region: Secondary | ICD-10-CM | POA: Diagnosis not present

## 2016-11-30 DIAGNOSIS — M9901 Segmental and somatic dysfunction of cervical region: Secondary | ICD-10-CM | POA: Diagnosis not present

## 2016-12-07 DIAGNOSIS — M9901 Segmental and somatic dysfunction of cervical region: Secondary | ICD-10-CM | POA: Diagnosis not present

## 2016-12-07 DIAGNOSIS — M9902 Segmental and somatic dysfunction of thoracic region: Secondary | ICD-10-CM | POA: Diagnosis not present

## 2016-12-07 DIAGNOSIS — M9903 Segmental and somatic dysfunction of lumbar region: Secondary | ICD-10-CM | POA: Diagnosis not present

## 2016-12-20 ENCOUNTER — Ambulatory Visit: Payer: 59 | Admitting: Psychology

## 2016-12-21 DIAGNOSIS — M9901 Segmental and somatic dysfunction of cervical region: Secondary | ICD-10-CM | POA: Diagnosis not present

## 2016-12-21 DIAGNOSIS — M9903 Segmental and somatic dysfunction of lumbar region: Secondary | ICD-10-CM | POA: Diagnosis not present

## 2016-12-21 DIAGNOSIS — M9902 Segmental and somatic dysfunction of thoracic region: Secondary | ICD-10-CM | POA: Diagnosis not present

## 2017-01-11 DIAGNOSIS — M9903 Segmental and somatic dysfunction of lumbar region: Secondary | ICD-10-CM | POA: Diagnosis not present

## 2017-01-11 DIAGNOSIS — M9902 Segmental and somatic dysfunction of thoracic region: Secondary | ICD-10-CM | POA: Diagnosis not present

## 2017-01-11 DIAGNOSIS — M9901 Segmental and somatic dysfunction of cervical region: Secondary | ICD-10-CM | POA: Diagnosis not present

## 2017-01-17 ENCOUNTER — Ambulatory Visit (INDEPENDENT_AMBULATORY_CARE_PROVIDER_SITE_OTHER): Payer: 59 | Admitting: Psychology

## 2017-01-17 DIAGNOSIS — F410 Panic disorder [episodic paroxysmal anxiety] without agoraphobia: Secondary | ICD-10-CM | POA: Diagnosis not present

## 2017-02-11 ENCOUNTER — Encounter: Payer: Self-pay | Admitting: Family Medicine

## 2017-02-11 ENCOUNTER — Other Ambulatory Visit: Payer: Self-pay | Admitting: Family Medicine

## 2017-02-11 ENCOUNTER — Ambulatory Visit (INDEPENDENT_AMBULATORY_CARE_PROVIDER_SITE_OTHER): Payer: 59 | Admitting: Family Medicine

## 2017-02-11 VITALS — BP 118/80 | HR 66 | Temp 97.7°F | Resp 12 | Ht 73.0 in | Wt 187.0 lb

## 2017-02-11 DIAGNOSIS — R519 Headache, unspecified: Secondary | ICD-10-CM

## 2017-02-11 DIAGNOSIS — Z23 Encounter for immunization: Secondary | ICD-10-CM | POA: Diagnosis not present

## 2017-02-11 DIAGNOSIS — R51 Headache: Principal | ICD-10-CM

## 2017-02-11 MED ORDER — SUMATRIPTAN SUCCINATE 100 MG PO TABS: 100.0000 mg | ORAL_TABLET | Freq: Every day | ORAL | 2 refills | Status: DC | PRN

## 2017-02-11 NOTE — Patient Instructions (Addendum)
A few things to remember from today's visit:   Headache, unspecified headache type - Plan: SUMAtriptan (IMITREX) 100 MG tablet   General Headache Without Cause A headache is pain or discomfort felt around the head or neck area. There are many causes and types of headaches. In some cases, the cause may not be found. Follow these instructions at home: Managing pain  Take over-the-counter and prescription medicines only as told by your doctor.  Lie down in a dark, quiet room when you have a headache.  If directed, apply ice to the head and neck area: ? Put ice in a plastic bag. ? Place a towel between your skin and the bag. ? Leave the ice on for 20 minutes, 2-3 times per day.  Use a heating pad or hot shower to apply heat to the head and neck area as told by your doctor.  Keep lights dim if bright lights bother you or make your headaches worse. Eating and drinking  Eat meals on a regular schedule.  Lessen how much alcohol you drink.  Lessen how much caffeine you drink, or stop drinking caffeine. General instructions  Keep all follow-up visits as told by your doctor. This is important.  Keep a journal to find out if certain things bring on headaches. For example, write down: ? What you eat and drink. ? How much sleep you get. ? Any change to your diet or medicines.  Relax by getting a massage or doing other relaxing activities.  Lessen stress.  Sit up straight. Do not tighten (tense) your muscles.  Do not use tobacco products. This includes cigarettes, chewing tobacco, or e-cigarettes. If you need help quitting, ask your doctor.  Exercise regularly as told by your doctor.  Get enough sleep. This often means 7-9 hours of sleep. Contact a doctor if:  Your symptoms are not helped by medicine.  You have a headache that feels different than the other headaches.  You feel sick to your stomach (nauseous) or you throw up (vomit).  You have a fever. Get help right away  if:  Your headache becomes really bad.  You keep throwing up.  You have a stiff neck.  You have trouble seeing.  You have trouble speaking.  You have pain in the eye or ear.  Your muscles are weak or you lose muscle control.  You lose your balance or have trouble walking.  You feel like you will pass out (faint) or you pass out.  You have confusion. This information is not intended to replace advice given to you by your health care provider. Make sure you discuss any questions you have with your health care provider. Document Released: 01/10/2008 Document Revised: 09/08/2015 Document Reviewed: 07/26/2014 Elsevier Interactive Patient Education  [employer]. Please be sure medication list is accurate. If a new problem present, please set up appointment sooner than planned today.

## 2017-02-11 NOTE — Progress Notes (Signed)
ACUTE VISIT   HPI:  Chief Complaint  Patient presents with  . Medication Refill    Cody Arias is a 51 y.o. male, who is here today requesting refills on Imitrex 100 mg, which he takes for migraine headaches. He has not followed in over a year and his PCP was not available today. He took last Imitrex last week.  Dx with migraine around 51 yo. Dull/throbbing,severe right temporo/frontal headache, right cervical pain frequently associated. Associated nausea, photophobia, and sometimes right epiphora.  Denies vomiting,numbness, or focal deficit.  For the past 2 months he has had more frequent headaches, once per week. It seems to be exacerbated by stress.   Usually he wakes up with headache, if he takes Imitrex right after onset,it helps.   Review of Systems  Constitutional: Negative for activity change, appetite change, fatigue and fever.  HENT: Negative for nosebleeds, sore throat and trouble swallowing.   Eyes: Positive for photophobia. Negative for redness and visual disturbance.  Respiratory: Negative for shortness of breath and wheezing.   Cardiovascular: Negative for palpitations and leg swelling.  Gastrointestinal: Negative for abdominal pain and vomiting.       No changes in bowel habits.  Genitourinary: Negative for decreased urine volume, dysuria and hematuria.  Musculoskeletal: Negative for gait problem and myalgias.  Skin: Negative for pallor and rash.  Neurological: Positive for headaches. Negative for dizziness, seizures, weakness and numbness.  Psychiatric/Behavioral: Negative for confusion. The patient is not nervous/anxious.       Current Outpatient Prescriptions on File Prior to Visit  Medication Sig Dispense Refill  . propranolol (INDERAL) 10 MG tablet Use as directed prn 30 tablet 1   No current facility-administered medications on file prior to visit.      Past Medical History:  Diagnosis Date  . Allergy    hayfever  . ASTHMA  08/26/2009   as child  . MIGRAINE HEADACHE 08/26/2009   No Known Allergies  Social History   Social History  . Marital status: Married    Spouse name: N/A  . Number of children: N/A  . Years of education: N/A   Social History Main Topics  . Smoking status: Never Smoker  . Smokeless tobacco: Never Used  . Alcohol use 1.8 oz/week    3 Glasses of wine per week  . Drug use: No  . Sexual activity: Not Asked   Other Topics Concern  . None   Social History Narrative  . None    Vitals:   02/11/17 1615  BP: 118/80  Pulse: 66  Resp: 12  Temp: 97.7 F (36.5 C)  SpO2: 99%   Body mass index is 24.67 kg/m.   Physical Exam  Nursing note and vitals reviewed. Constitutional: He is oriented to person, place, and time. He appears well-developed and well-nourished. No distress.  HENT:  Head: Normocephalic and atraumatic.  Mouth/Throat: Oropharynx is clear and moist and mucous membranes are normal.  Eyes: Pupils are equal, round, and reactive to light. Conjunctivae and EOM are normal.  Cardiovascular: Normal rate and regular rhythm.   No murmur heard. Respiratory: Effort normal and breath sounds normal. No respiratory distress.  Musculoskeletal: He exhibits no edema or tenderness.  Lymphadenopathy:    He has no cervical adenopathy.  Neurological: He is alert and oriented to person, place, and time. He has normal strength. No cranial nerve deficit or sensory deficit. Gait normal.  Skin: Skin is warm. No erythema.  Psychiatric: He has a normal  mood and affect. Cognition and memory are normal.  Well groomed, good eye contact.    ASSESSMENT AND PLAN:   Mr. Cody Arias was seen today for medication refill.  Diagnoses and all orders for this visit:  Headache, unspecified headache type  Hx of migraines. Other possible etiologies discussed, ? Cluster,tension. Some prophylactic options discussed to be considered. Imitrex helps, so continue. Instructed to keep a headche diary and  bring it to his next appt. Instructed about warning signs.  -     SUMAtriptan (IMITREX) 100 MG tablet; Take 1 tablet (100 mg total) by mouth daily as needed for migraine. One at onset of migraine and may repeat one in 2 hours as needed, maximum 2 in 24 hours  Need for influenza vaccination -     Flu Vaccine QUAD 36+ mos IM    Return if symptoms worsen or fail to improve, for Keep next f/u with PCP.     -[name] was advised to seek immediate medical attention if sudden worsening symptoms.     Cody Arias G. Martin[place], MD  [facility]. [place] office.

## 2017-02-19 DIAGNOSIS — M9901 Segmental and somatic dysfunction of cervical region: Secondary | ICD-10-CM | POA: Diagnosis not present

## 2017-02-19 DIAGNOSIS — M9902 Segmental and somatic dysfunction of thoracic region: Secondary | ICD-10-CM | POA: Diagnosis not present

## 2017-02-19 DIAGNOSIS — M9903 Segmental and somatic dysfunction of lumbar region: Secondary | ICD-10-CM | POA: Diagnosis not present

## 2017-02-22 ENCOUNTER — Ambulatory Visit (INDEPENDENT_AMBULATORY_CARE_PROVIDER_SITE_OTHER): Payer: 59 | Admitting: Family Medicine

## 2017-02-22 ENCOUNTER — Encounter: Payer: Self-pay | Admitting: Family Medicine

## 2017-02-22 VITALS — BP 110/70 | HR 94 | Temp 97.5°F | Ht 74.0 in | Wt 188.3 lb

## 2017-02-22 DIAGNOSIS — Z0000 Encounter for general adult medical examination without abnormal findings: Secondary | ICD-10-CM

## 2017-02-22 LAB — HEPATIC FUNCTION PANEL
ALBUMIN: 4.2 g/dL (ref 3.5–5.2)
ALK PHOS: 36 U/L — AB (ref 39–117)
ALT: 11 U/L (ref 0–53)
AST: 17 U/L (ref 0–37)
BILIRUBIN DIRECT: 0.2 mg/dL (ref 0.0–0.3)
TOTAL PROTEIN: 7 g/dL (ref 6.0–8.3)
Total Bilirubin: 1 mg/dL (ref 0.2–1.2)

## 2017-02-22 LAB — BASIC METABOLIC PANEL
BUN: 18 mg/dL (ref 6–23)
CALCIUM: 9.7 mg/dL (ref 8.4–10.5)
CO2: 32 mEq/L (ref 19–32)
CREATININE: 1.07 mg/dL (ref 0.40–1.50)
Chloride: 100 mEq/L (ref 96–112)
GFR: 76.64 mL/min (ref >60.00)
GLUCOSE: 72 mg/dL (ref 70–99)
Potassium: 4.8 mEq/L (ref 3.5–5.1)
SODIUM: 138 meq/L (ref 135–145)

## 2017-02-22 LAB — CBC WITH DIFFERENTIAL/PLATELET
BASOS ABS: 0.1 10*3/uL (ref 0.0–0.1)
Basophils Relative: 0.8 % (ref 0.0–3.0)
EOS PCT: 3 % (ref 0.0–5.0)
Eosinophils Absolute: 0.2 10*3/uL (ref 0.0–0.7)
HEMATOCRIT: 46.1 % (ref 39.0–52.0)
HEMOGLOBIN: 15.2 g/dL (ref 13.0–17.0)
LYMPHS PCT: 21.2 % (ref 12.0–46.0)
Lymphs Abs: 1.4 10*3/uL (ref 0.7–4.0)
MCHC: 32.9 g/dL (ref 30.0–36.0)
MCV: 89.8 fl (ref 78.0–100.0)
MONOS PCT: 11.8 % (ref 3.0–12.0)
Monocytes Absolute: 0.8 10*3/uL (ref 0.1–1.0)
Neutro Abs: 4.3 10*3/uL (ref 1.4–7.7)
Neutrophils Relative %: 63.2 % (ref 43.0–77.0)
Platelets: 235 10*3/uL (ref 150.0–400.0)
RBC: 5.13 Mil/uL (ref 4.22–5.81)
RDW: 14.5 % (ref 11.5–15.5)
WBC: 6.8 10*3/uL (ref 4.0–10.5)

## 2017-02-22 LAB — TSH: TSH: 1.93 u[IU]/mL (ref 0.35–4.50)

## 2017-02-22 LAB — LIPID PANEL
CHOL/HDL RATIO: 5
Cholesterol: 246 mg/dL — ABNORMAL HIGH (ref 0–200)
HDL: 53.2 mg/dL (ref >39.00)
LDL Cholesterol: 171 mg/dL — ABNORMAL HIGH (ref 0–99)
NONHDL: 192.71
Triglycerides: 110 mg/dL (ref 0.0–149.0)
VLDL: 22 mg/dL (ref 0.0–40.0)

## 2017-02-22 LAB — PSA: PSA: 0.66 ng/mL (ref 0.10–4.00)

## 2017-02-22 NOTE — Progress Notes (Signed)
Subjective:     Patient ID: Cody Arias, male   DOB: 06-20-1965, 51 y.o.   MRN: [id number]  HPI Patient is here for physical exam. His chronic problems include history of migraine headaches. He had basal cell carcinoma right ear and had surgery for that during the past year. He gets regular dermatology checkups. He's had some anxiety issues and has had some benefit from counseling with our psychologist. Overall feels well at this time. Appetite and weight are stable. Colonoscopy screening up-to-date. His father had prostate cancer and he requests repeat PSA testing. Low risk for hepatitis C. Never screened. Exercises mostly with tennis  Past Medical History:  Diagnosis Date  . Allergy    hayfever  . ASTHMA 08/26/2009   as child  . MIGRAINE HEADACHE 08/26/2009   Past Surgical History:  Procedure Laterality Date  . KNEE SURGERY Left Maniscus repair/ April 2017  . peridontal  2009, 2010  . SHOULDER ARTHROSCOPY W/ LABRAL REPAIR  2004  . SKIN CANCER EXCISION     basil cell x 4 (chest,back,and leg)  . VASECTOMY  2010    reports that  has never smoked. he has never used smokeless tobacco. He reports that he drinks about 1.8 oz of alcohol per week. He reports that he does not use drugs. family history includes Kidney disease in his maternal grandmother; Lupus in his maternal grandfather; Prostate cancer in his father and paternal grandfather. No Known Allergies   Review of Systems  Constitutional: Negative for activity change, appetite change, fatigue and fever.  HENT: Negative for congestion, ear pain and trouble swallowing.   Eyes: Negative for pain and visual disturbance.  Respiratory: Negative for cough, chest tightness, shortness of breath and wheezing.   Cardiovascular: Negative for chest pain, palpitations and leg swelling.  Gastrointestinal: Negative for abdominal distention, abdominal pain, blood in stool, constipation, diarrhea, nausea, rectal pain and vomiting.  Genitourinary:  Negative for dysuria, hematuria and testicular pain.  Musculoskeletal: Negative for arthralgias and joint swelling.  Skin: Negative for rash.  Neurological: Negative for dizziness, syncope, weakness, light-headedness and headaches.  Hematological: Negative for adenopathy.  Psychiatric/Behavioral: Negative for confusion and dysphoric mood.       Objective:   Physical Exam  Constitutional: He is oriented to person, place, and time. He appears well-developed and well-nourished. No distress.  HENT:  Head: Normocephalic and atraumatic.  Right Ear: External ear normal.  Left Ear: External ear normal.  Mouth/Throat: Oropharynx is clear and moist.  Eyes: Conjunctivae and EOM are normal. Pupils are equal, round, and reactive to light.  Neck: Normal range of motion. Neck supple. No thyromegaly present.  Cardiovascular: Normal rate, regular rhythm and normal heart sounds.  No murmur heard. Pulmonary/Chest: No respiratory distress. He has no wheezes. He has no rales.  Abdominal: Soft. Bowel sounds are normal. He exhibits no distension and no mass. There is no tenderness. There is no rebound and no guarding.  Musculoskeletal: He exhibits no edema.  Lymphadenopathy:    He has no cervical adenopathy.  Neurological: He is alert and oriented to person, place, and time. He displays normal reflexes. No cranial nerve deficit.  Skin: No rash noted.  Scarring of the right ear from previous ear surgery  Psychiatric: He has a normal mood and affect.       Assessment:     Physical exam. Issues addressed as below    Plan:     -Discussed shingles vaccine and he will check on coverage first -Obtain screening lab  work including hepatitis C antibody -The natural history of prostate cancer and ongoing controversy regarding screening and potential treatment outcomes of prostate cancer has been discussed with the patient. The meaning of a false positive PSA and a false negative PSA has been discussed. He  indicates understanding of the limitations of this screening test and wishes to proceed with screening PSA testing. -We need repeat colonoscopy in 2 years -Continue with yearly skin checks with prior history of basal cell carcinoma  Eulas Post MD [facility]

## 2017-02-22 NOTE — Patient Instructions (Signed)
Let us know if interested in getting the new shingles vaccine (Shingrix). We will call with lab results.

## 2017-02-23 LAB — HEPATITIS C ANTIBODY
HEP C AB: NONREACTIVE
SIGNAL TO CUT-OFF: 0.01 (ref <1.00)

## 2017-02-26 IMAGING — MR MR KNEE*L* W/O CM
4 of 6 series · 18 of 40 positions shown · non-contrast
Comparison: None.

CLINICAL DATA: Medial left knee pain for 1 month after tennis.

EXAM:
MRI OF THE LEFT KNEE WITHOUT CONTRAST
TECHNIQUE: Multiplanar, multisequence MR imaging of the knee was performed. No
intravenous contrast was administered.

[Series 3: PD fat-sat · axial · 4.0mm · 0.31mm/px · z∈[-52,+63]mm · 6 of 25 slices shown (1 of 4)]
[im 1/25]
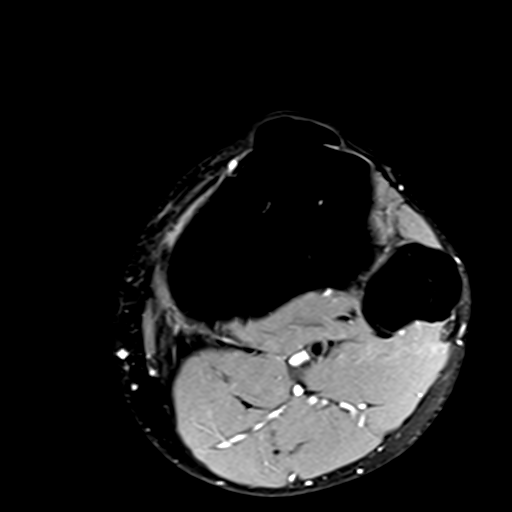
[im 5/25]
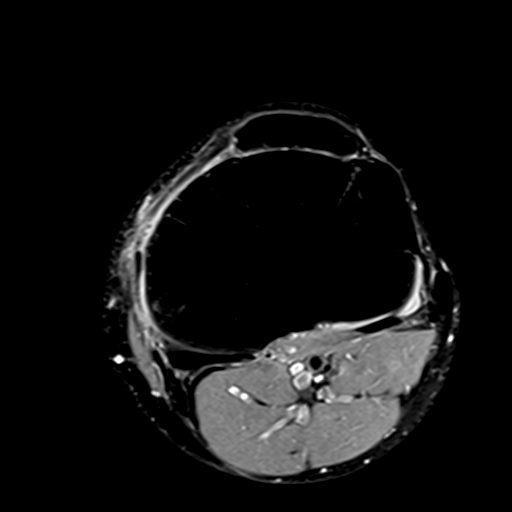
[im 10/25]
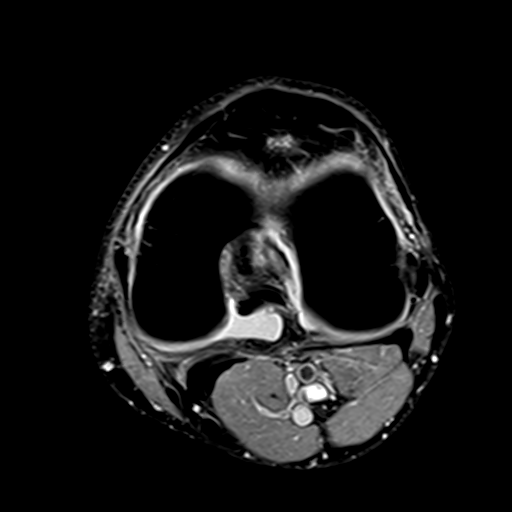
[im 15/25]
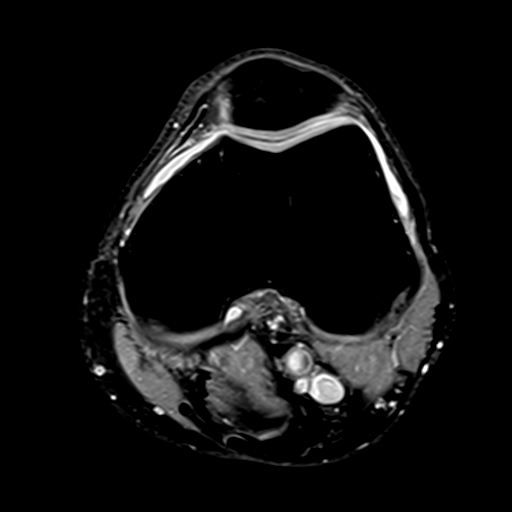
[im 20/25]
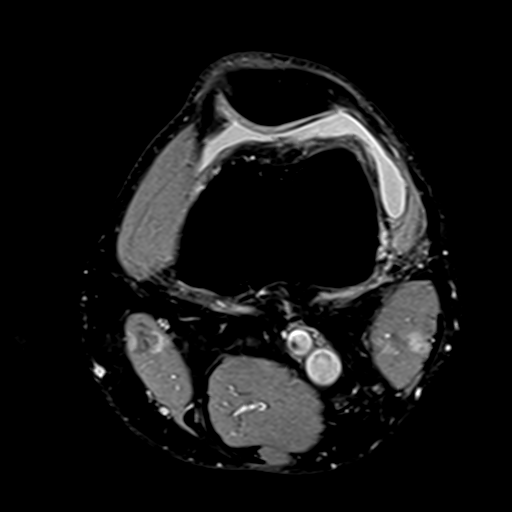
[im 25/25]
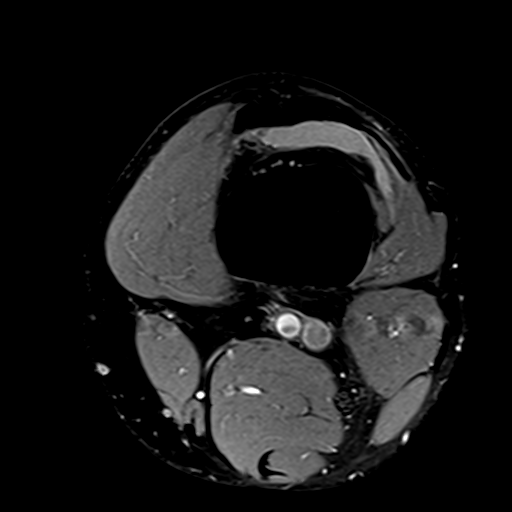

[Series 4: PD fat-sat · coronal · 3.5mm · 0.29mm/px · 6 of 25 slices shown (2 of 4)]
[im 1/25]
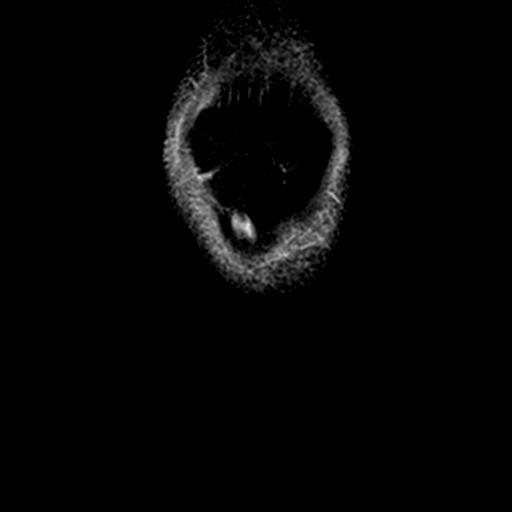
[im 5/25]
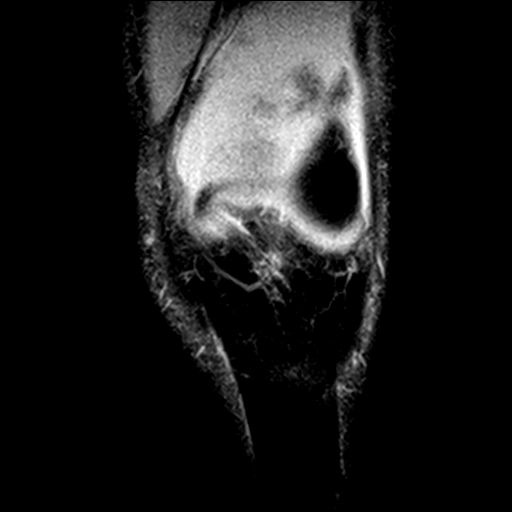
[im 9/25]
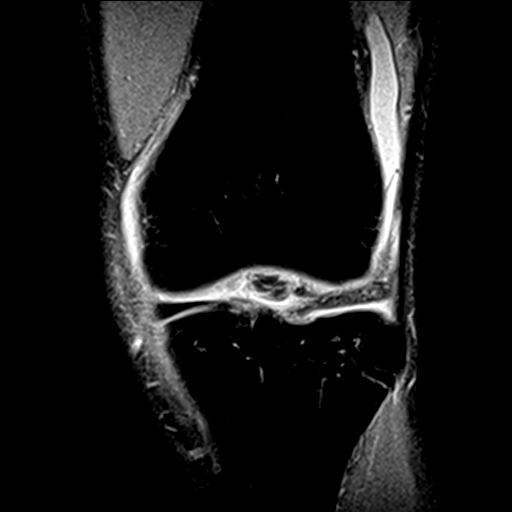
[im 13/25]
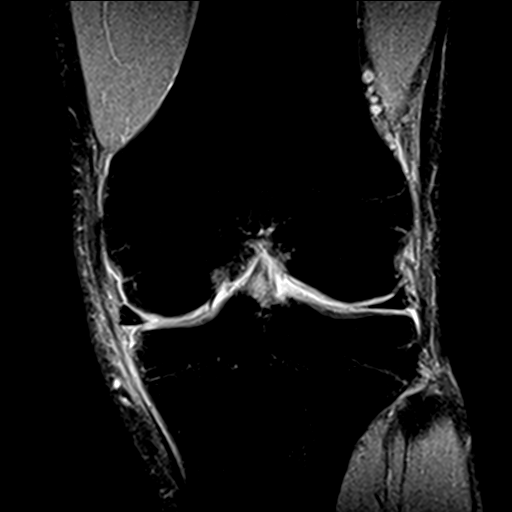
[im 17/25]
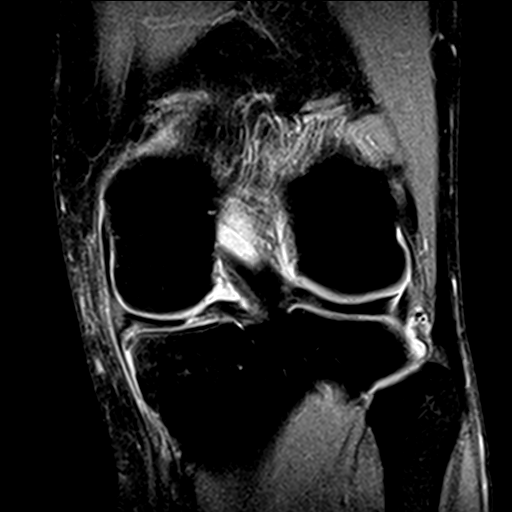
[im 21/25]
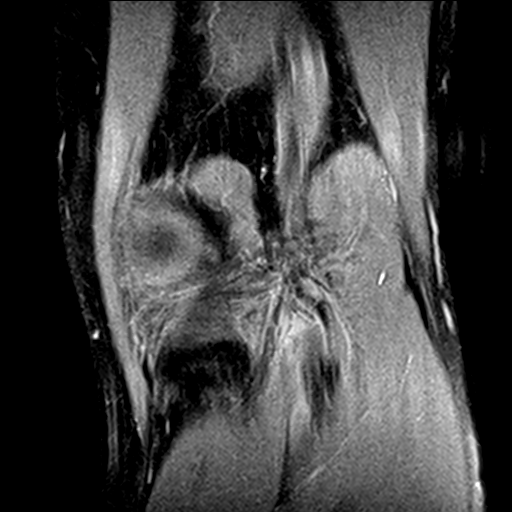

[Series 6: PD fat-sat · sagittal · 3.2mm · 0.29mm/px · 3 of 27 slices shown (3 of 4)]
[im 4/27]
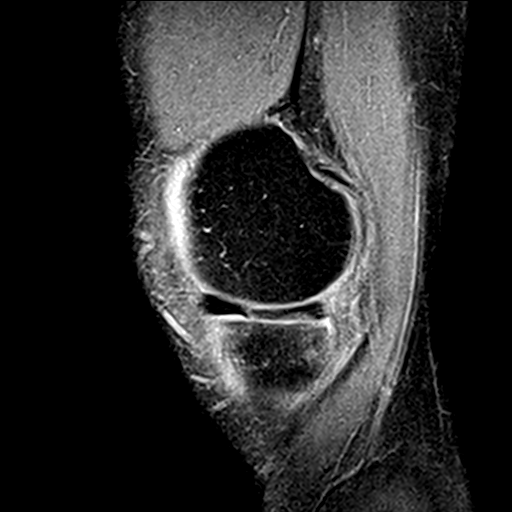
[im 15/27]
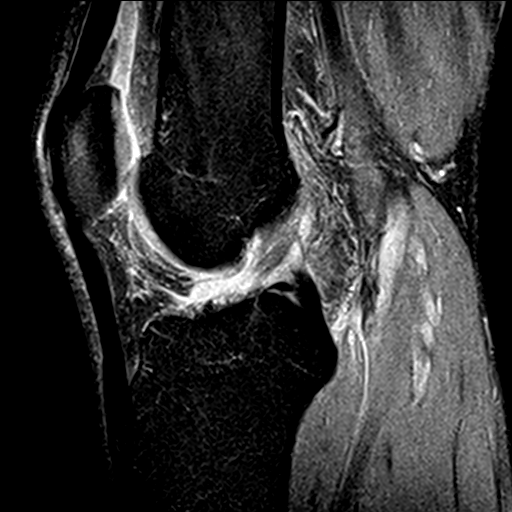
[im 23/27]
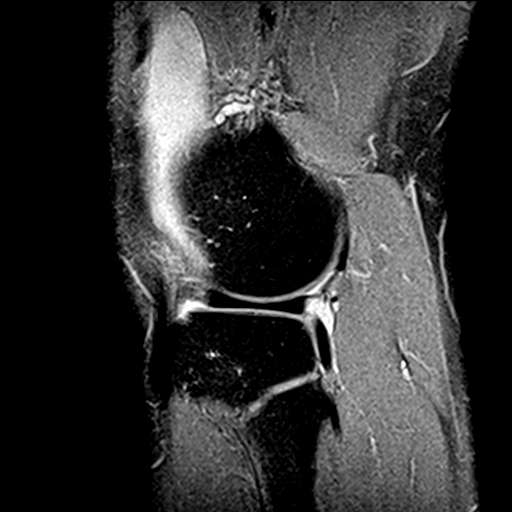

[Series 8: PD fat-sat · coronal · 2.0mm · 0.29mm/px · 3 of 17 slices shown (4 of 4)]
[im 1/17]
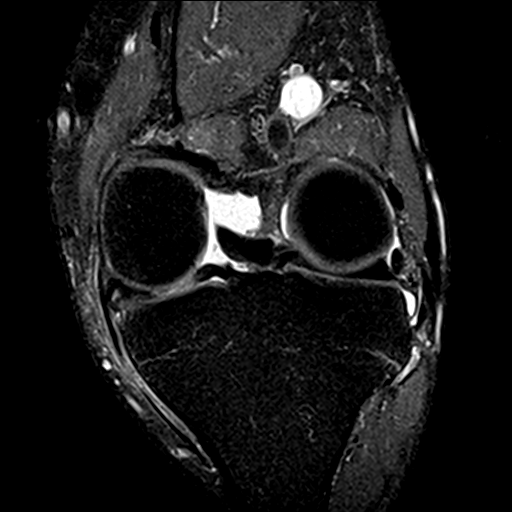
[im 9/17]
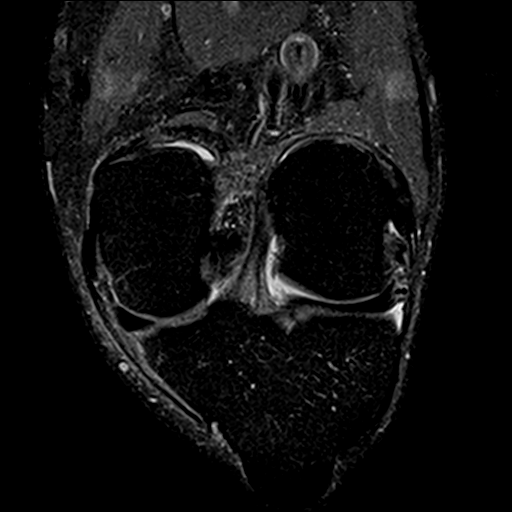
[im 17/17]
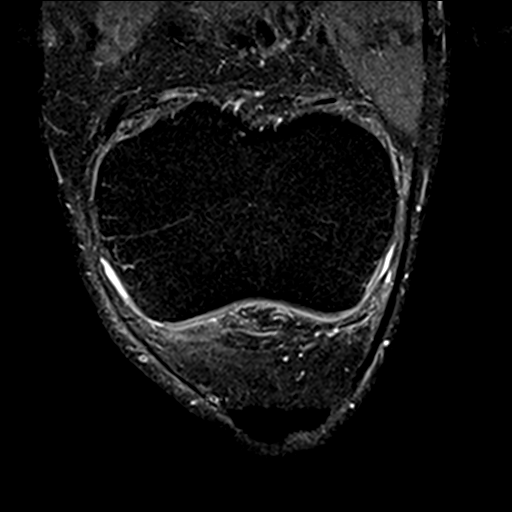

[18 of 40 positions shown; findings below may reference images not displayed]

FINDINGS: Despite efforts by the technologist and patient, motion artifact is
present on today's exam and could not be eliminated. This reduces
exam sensitivity and specificity.

MENISCI

Medial meniscus: Mildly complex tear of the midbody and posterior
horn medial meniscus with oblique component extending to the
inferior surface in the posterior horn on image 21 of series 6 but
with free edge truncation in the midbody with possible small local
flap extending caudad in the medial gutter and also potential small
flap along the free edge of the meniscal root.

Lateral meniscus:  Unremarkable

LIGAMENTS

Cruciates:  Unremarkable

Collaterals: Mild edema tracks adjacent to the MCL. This can be
incidental but in the appropriate clinical circumstance could
represent grade 1 sprain.

CARTILAGE

Patellofemoral:  Unremarkable

Medial: Mild chondral heterogeneity posterolaterally along the
medial femoral condyle, potentially from mild chondromalacia.

Lateral:  Unremarkable

Joint:  Small knee effusion.

Popliteal Fossa:  Unremarkable

Extensor Mechanism: Proximal patellar tendinopathy or partial
tearing, images 15 through 16 series 6.

Bones: Subtle marrow edema along the medial rim of the medial tibial
plateau.
IMPRESSION: 1. Complex tear of the posterior horn and midbody medial meniscus
potentially with several small flap components, and with both
oblique and radial components of the tear.
2. Mild edema tracks adjacent to the MCL. This can be incidental but
in the appropriate clinical circumstance could represent grade 1
sprain.
3. Proximal patellar tendinopathy or mild partial tearing.
4. Small knee effusion.
5. Subtle chondral heterogeneity posterolaterally along the medial
femoral condyle, possibly from a small subtle chondral injury or
mild localized chondromalacia.

## 2017-02-28 ENCOUNTER — Ambulatory Visit (INDEPENDENT_AMBULATORY_CARE_PROVIDER_SITE_OTHER): Payer: 59 | Admitting: Psychology

## 2017-02-28 DIAGNOSIS — F410 Panic disorder [episodic paroxysmal anxiety] without agoraphobia: Secondary | ICD-10-CM

## 2017-03-01 DIAGNOSIS — M9903 Segmental and somatic dysfunction of lumbar region: Secondary | ICD-10-CM | POA: Diagnosis not present

## 2017-03-01 DIAGNOSIS — M9901 Segmental and somatic dysfunction of cervical region: Secondary | ICD-10-CM | POA: Diagnosis not present

## 2017-03-01 DIAGNOSIS — M9902 Segmental and somatic dysfunction of thoracic region: Secondary | ICD-10-CM | POA: Diagnosis not present

## 2017-03-15 DIAGNOSIS — M9903 Segmental and somatic dysfunction of lumbar region: Secondary | ICD-10-CM | POA: Diagnosis not present

## 2017-03-15 DIAGNOSIS — M9901 Segmental and somatic dysfunction of cervical region: Secondary | ICD-10-CM | POA: Diagnosis not present

## 2017-03-15 DIAGNOSIS — M9902 Segmental and somatic dysfunction of thoracic region: Secondary | ICD-10-CM | POA: Diagnosis not present

## 2017-03-29 DIAGNOSIS — M9902 Segmental and somatic dysfunction of thoracic region: Secondary | ICD-10-CM | POA: Diagnosis not present

## 2017-03-29 DIAGNOSIS — M9903 Segmental and somatic dysfunction of lumbar region: Secondary | ICD-10-CM | POA: Diagnosis not present

## 2017-03-29 DIAGNOSIS — M9901 Segmental and somatic dysfunction of cervical region: Secondary | ICD-10-CM | POA: Diagnosis not present

## 2017-04-04 ENCOUNTER — Ambulatory Visit (INDEPENDENT_AMBULATORY_CARE_PROVIDER_SITE_OTHER): Payer: 59 | Admitting: Psychology

## 2017-04-04 DIAGNOSIS — F410 Panic disorder [episodic paroxysmal anxiety] without agoraphobia: Secondary | ICD-10-CM

## 2017-04-05 DIAGNOSIS — M9902 Segmental and somatic dysfunction of thoracic region: Secondary | ICD-10-CM | POA: Diagnosis not present

## 2017-04-05 DIAGNOSIS — M9901 Segmental and somatic dysfunction of cervical region: Secondary | ICD-10-CM | POA: Diagnosis not present

## 2017-04-05 DIAGNOSIS — M9903 Segmental and somatic dysfunction of lumbar region: Secondary | ICD-10-CM | POA: Diagnosis not present

## 2017-04-19 DIAGNOSIS — M9903 Segmental and somatic dysfunction of lumbar region: Secondary | ICD-10-CM | POA: Diagnosis not present

## 2017-04-19 DIAGNOSIS — M9902 Segmental and somatic dysfunction of thoracic region: Secondary | ICD-10-CM | POA: Diagnosis not present

## 2017-04-19 DIAGNOSIS — M9901 Segmental and somatic dysfunction of cervical region: Secondary | ICD-10-CM | POA: Diagnosis not present

## 2017-05-03 DIAGNOSIS — M9902 Segmental and somatic dysfunction of thoracic region: Secondary | ICD-10-CM | POA: Diagnosis not present

## 2017-05-03 DIAGNOSIS — M9903 Segmental and somatic dysfunction of lumbar region: Secondary | ICD-10-CM | POA: Diagnosis not present

## 2017-05-03 DIAGNOSIS — M9901 Segmental and somatic dysfunction of cervical region: Secondary | ICD-10-CM | POA: Diagnosis not present

## 2017-05-09 DIAGNOSIS — J209 Acute bronchitis, unspecified: Secondary | ICD-10-CM | POA: Diagnosis not present

## 2017-05-17 DIAGNOSIS — M9903 Segmental and somatic dysfunction of lumbar region: Secondary | ICD-10-CM | POA: Diagnosis not present

## 2017-05-17 DIAGNOSIS — M9901 Segmental and somatic dysfunction of cervical region: Secondary | ICD-10-CM | POA: Diagnosis not present

## 2017-05-17 DIAGNOSIS — M9902 Segmental and somatic dysfunction of thoracic region: Secondary | ICD-10-CM | POA: Diagnosis not present

## 2017-05-20 ENCOUNTER — Ambulatory Visit (INDEPENDENT_AMBULATORY_CARE_PROVIDER_SITE_OTHER): Payer: 59 | Admitting: Psychology

## 2017-05-20 DIAGNOSIS — F410 Panic disorder [episodic paroxysmal anxiety] without agoraphobia: Secondary | ICD-10-CM | POA: Diagnosis not present

## 2017-05-24 DIAGNOSIS — M9902 Segmental and somatic dysfunction of thoracic region: Secondary | ICD-10-CM | POA: Diagnosis not present

## 2017-05-24 DIAGNOSIS — M9901 Segmental and somatic dysfunction of cervical region: Secondary | ICD-10-CM | POA: Diagnosis not present

## 2017-05-24 DIAGNOSIS — M9903 Segmental and somatic dysfunction of lumbar region: Secondary | ICD-10-CM | POA: Diagnosis not present

## 2017-06-14 DIAGNOSIS — M9902 Segmental and somatic dysfunction of thoracic region: Secondary | ICD-10-CM | POA: Diagnosis not present

## 2017-06-14 DIAGNOSIS — M9903 Segmental and somatic dysfunction of lumbar region: Secondary | ICD-10-CM | POA: Diagnosis not present

## 2017-06-14 DIAGNOSIS — M9901 Segmental and somatic dysfunction of cervical region: Secondary | ICD-10-CM | POA: Diagnosis not present

## 2017-06-28 DIAGNOSIS — M9901 Segmental and somatic dysfunction of cervical region: Secondary | ICD-10-CM | POA: Diagnosis not present

## 2017-06-28 DIAGNOSIS — M9903 Segmental and somatic dysfunction of lumbar region: Secondary | ICD-10-CM | POA: Diagnosis not present

## 2017-06-28 DIAGNOSIS — M9902 Segmental and somatic dysfunction of thoracic region: Secondary | ICD-10-CM | POA: Diagnosis not present

## 2017-07-04 ENCOUNTER — Ambulatory Visit (INDEPENDENT_AMBULATORY_CARE_PROVIDER_SITE_OTHER): Payer: 59 | Admitting: Psychology

## 2017-07-04 DIAGNOSIS — F410 Panic disorder [episodic paroxysmal anxiety] without agoraphobia: Secondary | ICD-10-CM | POA: Diagnosis not present

## 2017-07-12 DIAGNOSIS — M9901 Segmental and somatic dysfunction of cervical region: Secondary | ICD-10-CM | POA: Diagnosis not present

## 2017-07-12 DIAGNOSIS — M9903 Segmental and somatic dysfunction of lumbar region: Secondary | ICD-10-CM | POA: Diagnosis not present

## 2017-07-12 DIAGNOSIS — M9902 Segmental and somatic dysfunction of thoracic region: Secondary | ICD-10-CM | POA: Diagnosis not present

## 2017-07-26 DIAGNOSIS — M9903 Segmental and somatic dysfunction of lumbar region: Secondary | ICD-10-CM | POA: Diagnosis not present

## 2017-07-26 DIAGNOSIS — M9901 Segmental and somatic dysfunction of cervical region: Secondary | ICD-10-CM | POA: Diagnosis not present

## 2017-07-26 DIAGNOSIS — M9902 Segmental and somatic dysfunction of thoracic region: Secondary | ICD-10-CM | POA: Diagnosis not present

## 2017-08-15 ENCOUNTER — Ambulatory Visit: Payer: 59 | Admitting: Psychology

## 2017-08-16 DIAGNOSIS — M9903 Segmental and somatic dysfunction of lumbar region: Secondary | ICD-10-CM | POA: Diagnosis not present

## 2017-08-16 DIAGNOSIS — M9902 Segmental and somatic dysfunction of thoracic region: Secondary | ICD-10-CM | POA: Diagnosis not present

## 2017-08-16 DIAGNOSIS — M9901 Segmental and somatic dysfunction of cervical region: Secondary | ICD-10-CM | POA: Diagnosis not present

## 2017-08-30 DIAGNOSIS — M9902 Segmental and somatic dysfunction of thoracic region: Secondary | ICD-10-CM | POA: Diagnosis not present

## 2017-08-30 DIAGNOSIS — M9903 Segmental and somatic dysfunction of lumbar region: Secondary | ICD-10-CM | POA: Diagnosis not present

## 2017-08-30 DIAGNOSIS — M9901 Segmental and somatic dysfunction of cervical region: Secondary | ICD-10-CM | POA: Diagnosis not present

## 2017-09-13 DIAGNOSIS — M9903 Segmental and somatic dysfunction of lumbar region: Secondary | ICD-10-CM | POA: Diagnosis not present

## 2017-09-13 DIAGNOSIS — M9902 Segmental and somatic dysfunction of thoracic region: Secondary | ICD-10-CM | POA: Diagnosis not present

## 2017-09-13 DIAGNOSIS — M9901 Segmental and somatic dysfunction of cervical region: Secondary | ICD-10-CM | POA: Diagnosis not present

## 2017-09-27 DIAGNOSIS — M9901 Segmental and somatic dysfunction of cervical region: Secondary | ICD-10-CM | POA: Diagnosis not present

## 2017-09-27 DIAGNOSIS — M9903 Segmental and somatic dysfunction of lumbar region: Secondary | ICD-10-CM | POA: Diagnosis not present

## 2017-09-27 DIAGNOSIS — M9902 Segmental and somatic dysfunction of thoracic region: Secondary | ICD-10-CM | POA: Diagnosis not present

## 2017-10-11 DIAGNOSIS — M9902 Segmental and somatic dysfunction of thoracic region: Secondary | ICD-10-CM | POA: Diagnosis not present

## 2017-10-11 DIAGNOSIS — M9903 Segmental and somatic dysfunction of lumbar region: Secondary | ICD-10-CM | POA: Diagnosis not present

## 2017-10-11 DIAGNOSIS — M9901 Segmental and somatic dysfunction of cervical region: Secondary | ICD-10-CM | POA: Diagnosis not present

## 2017-10-25 DIAGNOSIS — M9901 Segmental and somatic dysfunction of cervical region: Secondary | ICD-10-CM | POA: Diagnosis not present

## 2017-10-25 DIAGNOSIS — M9902 Segmental and somatic dysfunction of thoracic region: Secondary | ICD-10-CM | POA: Diagnosis not present

## 2017-10-25 DIAGNOSIS — M9903 Segmental and somatic dysfunction of lumbar region: Secondary | ICD-10-CM | POA: Diagnosis not present

## 2017-11-08 DIAGNOSIS — M9901 Segmental and somatic dysfunction of cervical region: Secondary | ICD-10-CM | POA: Diagnosis not present

## 2017-11-08 DIAGNOSIS — M9903 Segmental and somatic dysfunction of lumbar region: Secondary | ICD-10-CM | POA: Diagnosis not present

## 2017-11-08 DIAGNOSIS — M9902 Segmental and somatic dysfunction of thoracic region: Secondary | ICD-10-CM | POA: Diagnosis not present

## 2017-11-18 DIAGNOSIS — D225 Melanocytic nevi of trunk: Secondary | ICD-10-CM | POA: Diagnosis not present

## 2017-11-18 DIAGNOSIS — L570 Actinic keratosis: Secondary | ICD-10-CM | POA: Diagnosis not present

## 2017-11-18 DIAGNOSIS — L814 Other melanin hyperpigmentation: Secondary | ICD-10-CM | POA: Diagnosis not present

## 2017-11-18 DIAGNOSIS — D1801 Hemangioma of skin and subcutaneous tissue: Secondary | ICD-10-CM | POA: Diagnosis not present

## 2017-11-22 DIAGNOSIS — M9902 Segmental and somatic dysfunction of thoracic region: Secondary | ICD-10-CM | POA: Diagnosis not present

## 2017-11-22 DIAGNOSIS — M9903 Segmental and somatic dysfunction of lumbar region: Secondary | ICD-10-CM | POA: Diagnosis not present

## 2017-11-22 DIAGNOSIS — M9901 Segmental and somatic dysfunction of cervical region: Secondary | ICD-10-CM | POA: Diagnosis not present

## 2017-12-06 DIAGNOSIS — M9903 Segmental and somatic dysfunction of lumbar region: Secondary | ICD-10-CM | POA: Diagnosis not present

## 2017-12-06 DIAGNOSIS — M9902 Segmental and somatic dysfunction of thoracic region: Secondary | ICD-10-CM | POA: Diagnosis not present

## 2017-12-06 DIAGNOSIS — M9901 Segmental and somatic dysfunction of cervical region: Secondary | ICD-10-CM | POA: Diagnosis not present

## 2018-01-03 DIAGNOSIS — M9901 Segmental and somatic dysfunction of cervical region: Secondary | ICD-10-CM | POA: Diagnosis not present

## 2018-01-03 DIAGNOSIS — M9902 Segmental and somatic dysfunction of thoracic region: Secondary | ICD-10-CM | POA: Diagnosis not present

## 2018-01-03 DIAGNOSIS — M9903 Segmental and somatic dysfunction of lumbar region: Secondary | ICD-10-CM | POA: Diagnosis not present

## 2018-01-24 DIAGNOSIS — M9902 Segmental and somatic dysfunction of thoracic region: Secondary | ICD-10-CM | POA: Diagnosis not present

## 2018-01-24 DIAGNOSIS — M9901 Segmental and somatic dysfunction of cervical region: Secondary | ICD-10-CM | POA: Diagnosis not present

## 2018-01-24 DIAGNOSIS — M9903 Segmental and somatic dysfunction of lumbar region: Secondary | ICD-10-CM | POA: Diagnosis not present

## 2018-02-07 DIAGNOSIS — M9903 Segmental and somatic dysfunction of lumbar region: Secondary | ICD-10-CM | POA: Diagnosis not present

## 2018-02-07 DIAGNOSIS — M9901 Segmental and somatic dysfunction of cervical region: Secondary | ICD-10-CM | POA: Diagnosis not present

## 2018-02-07 DIAGNOSIS — M9902 Segmental and somatic dysfunction of thoracic region: Secondary | ICD-10-CM | POA: Diagnosis not present

## 2018-03-03 DIAGNOSIS — J029 Acute pharyngitis, unspecified: Secondary | ICD-10-CM | POA: Diagnosis not present

## 2018-04-18 DIAGNOSIS — M9903 Segmental and somatic dysfunction of lumbar region: Secondary | ICD-10-CM | POA: Diagnosis not present

## 2018-04-18 DIAGNOSIS — M9902 Segmental and somatic dysfunction of thoracic region: Secondary | ICD-10-CM | POA: Diagnosis not present

## 2018-04-18 DIAGNOSIS — M9901 Segmental and somatic dysfunction of cervical region: Secondary | ICD-10-CM | POA: Diagnosis not present

## 2018-05-02 DIAGNOSIS — M9901 Segmental and somatic dysfunction of cervical region: Secondary | ICD-10-CM | POA: Diagnosis not present

## 2018-05-02 DIAGNOSIS — M9903 Segmental and somatic dysfunction of lumbar region: Secondary | ICD-10-CM | POA: Diagnosis not present

## 2018-05-02 DIAGNOSIS — M9902 Segmental and somatic dysfunction of thoracic region: Secondary | ICD-10-CM | POA: Diagnosis not present

## 2018-05-30 ENCOUNTER — Encounter: Payer: Self-pay | Admitting: *Deleted

## 2018-05-30 DIAGNOSIS — M9901 Segmental and somatic dysfunction of cervical region: Secondary | ICD-10-CM | POA: Diagnosis not present

## 2018-05-30 DIAGNOSIS — M9902 Segmental and somatic dysfunction of thoracic region: Secondary | ICD-10-CM | POA: Diagnosis not present

## 2018-05-30 DIAGNOSIS — M9903 Segmental and somatic dysfunction of lumbar region: Secondary | ICD-10-CM | POA: Diagnosis not present

## 2018-06-11 ENCOUNTER — Ambulatory Visit (INDEPENDENT_AMBULATORY_CARE_PROVIDER_SITE_OTHER): Payer: 59 | Admitting: Family Medicine

## 2018-06-11 ENCOUNTER — Other Ambulatory Visit: Payer: Self-pay

## 2018-06-11 ENCOUNTER — Encounter: Payer: Self-pay | Admitting: Family Medicine

## 2018-06-11 VITALS — BP 108/64 | HR 81 | Temp 97.6°F | Ht 72.5 in | Wt 188.6 lb

## 2018-06-11 DIAGNOSIS — Z0000 Encounter for general adult medical examination without abnormal findings: Secondary | ICD-10-CM

## 2018-06-11 DIAGNOSIS — R51 Headache: Secondary | ICD-10-CM

## 2018-06-11 DIAGNOSIS — Z23 Encounter for immunization: Secondary | ICD-10-CM

## 2018-06-11 DIAGNOSIS — R519 Headache, unspecified: Secondary | ICD-10-CM

## 2018-06-11 LAB — CBC WITH DIFFERENTIAL/PLATELET
BASOS ABS: 0 10*3/uL (ref 0.0–0.1)
Basophils Relative: 0.8 % (ref 0.0–3.0)
EOS ABS: 0.2 10*3/uL (ref 0.0–0.7)
Eosinophils Relative: 3.4 % (ref 0.0–5.0)
HCT: 44.3 % (ref 39.0–52.0)
HEMOGLOBIN: 14.6 g/dL (ref 13.0–17.0)
LYMPHS PCT: 20.4 % (ref 12.0–46.0)
Lymphs Abs: 1.2 10*3/uL (ref 0.7–4.0)
MCHC: 33.1 g/dL (ref 30.0–36.0)
MCV: 87.7 fl (ref 78.0–100.0)
Monocytes Absolute: 0.8 10*3/uL (ref 0.1–1.0)
Monocytes Relative: 14 % — ABNORMAL HIGH (ref 3.0–12.0)
Neutro Abs: 3.6 10*3/uL (ref 1.4–7.7)
Neutrophils Relative %: 61.4 % (ref 43.0–77.0)
Platelets: 197 10*3/uL (ref 150.0–400.0)
RBC: 5.05 Mil/uL (ref 4.22–5.81)
RDW: 15 % (ref 11.5–15.5)
WBC: 5.9 10*3/uL (ref 4.0–10.5)

## 2018-06-11 LAB — HEPATIC FUNCTION PANEL
ALT: 12 U/L (ref 0–53)
AST: 17 U/L (ref 0–37)
Albumin: 4.1 g/dL (ref 3.5–5.2)
Alkaline Phosphatase: 37 U/L — ABNORMAL LOW (ref 39–117)
BILIRUBIN DIRECT: 0.1 mg/dL (ref 0.0–0.3)
BILIRUBIN TOTAL: 0.8 mg/dL (ref 0.2–1.2)
Total Protein: 6.8 g/dL (ref 6.0–8.3)

## 2018-06-11 LAB — BASIC METABOLIC PANEL
BUN: 21 mg/dL (ref 6–23)
CALCIUM: 9.3 mg/dL (ref 8.4–10.5)
CO2: 31 mEq/L (ref 19–32)
CREATININE: 1.11 mg/dL (ref 0.40–1.50)
Chloride: 103 mEq/L (ref 96–112)
GFR: 68.79 mL/min (ref >60.00)
GLUCOSE: 77 mg/dL (ref 70–99)
Potassium: 5 mEq/L (ref 3.5–5.1)
SODIUM: 139 meq/L (ref 135–145)

## 2018-06-11 LAB — LIPID PANEL
CHOL/HDL RATIO: 4
Cholesterol: 209 mg/dL — ABNORMAL HIGH (ref 0–200)
HDL: 58.9 mg/dL (ref >39.00)
LDL CALC: 132 mg/dL — AB (ref 0–99)
NonHDL: 150.15
Triglycerides: 91 mg/dL (ref 0.0–149.0)
VLDL: 18.2 mg/dL (ref 0.0–40.0)

## 2018-06-11 MED ORDER — SUMATRIPTAN SUCCINATE 100 MG PO TABS: ORAL_TABLET | ORAL | 5 refills | Status: DC

## 2018-06-11 MED ORDER — PROPRANOLOL HCL 10 MG PO TABS: ORAL_TABLET | ORAL | 1 refills | Status: DC

## 2018-06-11 NOTE — Patient Instructions (Signed)
Consider Shingrix vaccne and check on insurance if interested.   Need follow up Colonoscopy with Dr Zenovia Jarred.

## 2018-06-11 NOTE — Progress Notes (Signed)
Subjective:     Patient ID: Cody Arias, male   DOB: 1965-11-04, 53 y.o.   MRN: [id number]  HPI  Patient is seen for physical exam.  He has history of infrequent migraines and takes Imitrex for that.  Generally very healthy.  Exercises most days of the week.  Feels well overall.  He has had sensation of fluid in his right ear recently.  No hearing loss.  No dizziness.  No ear pain.  Patient had benign polyps on colonoscopy 5 years ago and is due for follow-up soon.  He needs tetanus booster.  No history of shingles vaccine.  Family history significant for father dying of prostate cancer.  His mother had lung cancer.  He has 2 sons and daughters will be starting college next year.  Other 1 starts high school.  Past Medical History:  Diagnosis Date  . Allergy    hayfever  . ASTHMA 08/26/2009   as child  . MIGRAINE HEADACHE 08/26/2009   Past Surgical History:  Procedure Laterality Date  . KNEE SURGERY Left Maniscus repair/ April 2017  . peridontal  2009, 2010  . SHOULDER ARTHROSCOPY W/ LABRAL REPAIR  2004  . SKIN CANCER EXCISION     basil cell x 4 (chest,back,and leg)  . VASECTOMY  2010    reports that he has never smoked. He has never used smokeless tobacco. He reports current alcohol use of about 3.0 standard drinks of alcohol per week. He reports that he does not use drugs. family history includes Kidney disease in his maternal grandmother; Lupus in his maternal grandfather; Prostate cancer in his father and paternal grandfather. No Known Allergies  Review of Systems  Constitutional: Negative for activity change, appetite change, fatigue and fever.  HENT: Negative for congestion, ear pain and trouble swallowing.   Eyes: Negative for pain and visual disturbance.  Respiratory: Negative for cough, shortness of breath and wheezing.   Cardiovascular: Negative for chest pain and palpitations.  Gastrointestinal: Negative for abdominal distention, abdominal pain, blood in stool,  constipation, diarrhea, nausea, rectal pain and vomiting.  Genitourinary: Negative for dysuria, hematuria and testicular pain.  Musculoskeletal: Negative for arthralgias and joint swelling.  Skin: Negative for rash.  Neurological: Negative for dizziness, syncope and headaches.  Hematological: Negative for adenopathy.  Psychiatric/Behavioral: Negative for confusion and dysphoric mood.       Objective:   Physical Exam Constitutional:      General: He is not in acute distress.    Appearance: He is well-developed.  HENT:     Head: Normocephalic and atraumatic.     Comments: No cerumen in right canal and eardrum appears normal    Right Ear: Tympanic membrane, ear canal and external ear normal.     Left Ear: Tympanic membrane, ear canal and external ear normal.  Eyes:     Conjunctiva/sclera: Conjunctivae normal.     Pupils: Pupils are equal, round, and reactive to light.  Neck:     Musculoskeletal: Normal range of motion and neck supple.     Thyroid: No thyromegaly.  Cardiovascular:     Rate and Rhythm: Normal rate and regular rhythm.     Heart sounds: Normal heart sounds. No murmur.  Pulmonary:     Effort: No respiratory distress.     Breath sounds: No wheezing or rales.  Abdominal:     General: Bowel sounds are normal. There is no distension.     Palpations: Abdomen is soft. There is no mass.  Tenderness: There is no abdominal tenderness. There is no guarding or rebound.  Lymphadenopathy:     Cervical: No cervical adenopathy.  Skin:    Findings: No rash.  Neurological:     Mental Status: He is alert and oriented to person, place, and time.     Cranial Nerves: No cranial nerve deficit.     Deep Tendon Reflexes: Reflexes normal.        Assessment:     Physical exam.  Patient is a very healthy 53 year old male. He has sensation of "fluid in ear "but no evidence for cerumen or any visible effusion.  Question eustachian tube dysfunction.  Touch base if not improving or  resolving over the next several weeks    Plan:     -Obtain screening labs including PSA particularly in view of his positive family history of prostate cancer  -Tetanus booster given  -He will call to schedule follow-up colonoscopy which is due  -Discussed Shingrix vaccine he will check on insurance coverage  Eulas Post MD [facility]

## 2018-06-12 LAB — TSH: TSH: 1.45 u[IU]/mL (ref 0.35–4.50)

## 2018-06-12 LAB — PSA: PSA: 0.54 ng/mL (ref 0.10–4.00)

## 2018-06-13 DIAGNOSIS — M9902 Segmental and somatic dysfunction of thoracic region: Secondary | ICD-10-CM | POA: Diagnosis not present

## 2018-06-13 DIAGNOSIS — M9901 Segmental and somatic dysfunction of cervical region: Secondary | ICD-10-CM | POA: Diagnosis not present

## 2018-06-13 DIAGNOSIS — M9903 Segmental and somatic dysfunction of lumbar region: Secondary | ICD-10-CM | POA: Diagnosis not present

## 2018-06-27 DIAGNOSIS — M9902 Segmental and somatic dysfunction of thoracic region: Secondary | ICD-10-CM | POA: Diagnosis not present

## 2018-06-27 DIAGNOSIS — M9903 Segmental and somatic dysfunction of lumbar region: Secondary | ICD-10-CM | POA: Diagnosis not present

## 2018-06-27 DIAGNOSIS — M9901 Segmental and somatic dysfunction of cervical region: Secondary | ICD-10-CM | POA: Diagnosis not present

## 2018-08-29 ENCOUNTER — Encounter: Payer: Self-pay | Admitting: Internal Medicine

## 2018-09-25 ENCOUNTER — Other Ambulatory Visit: Payer: Self-pay

## 2018-09-25 ENCOUNTER — Ambulatory Visit: Payer: 59 | Admitting: *Deleted

## 2018-09-25 ENCOUNTER — Encounter: Payer: Self-pay | Admitting: *Deleted

## 2018-09-25 VITALS — Ht 73.0 in | Wt 187.0 lb

## 2018-09-25 DIAGNOSIS — Z86010 Personal history of colonic polyps: Secondary | ICD-10-CM

## 2018-09-25 MED ORDER — NA SULFATE-K SULFATE-MG SULF 17.5-3.13-1.6 GM/177ML PO SOLN: ORAL | 0 refills | Status: DC

## 2018-09-25 NOTE — Progress Notes (Signed)
Patient's pre-visit was done today over the phone with the patient due to Covid-19. Name,DOB and address verified. Insurance verified. Packet of Prep instructions mailed to patient including copy of a consent form and pre-procedure patient acknowledgement form- and Suprep coupon-pt is aware. Patient understands to call us back with any questions or concerns.   Patient denies any allergies to eggs or soy. Patient denies any problems with anesthesia/sedation. Patient denies any oxygen use at home. Patient denies taking any diet/weight loss medications or blood thinners. EMMI education assisgned to patient on colonoscopy, this was explained and instructions given to patient. 

## 2018-10-09 ENCOUNTER — Encounter: Payer: 59 | Admitting: Internal Medicine

## 2018-10-23 ENCOUNTER — Encounter: Payer: Self-pay | Admitting: Internal Medicine

## 2018-11-05 ENCOUNTER — Telehealth: Payer: Self-pay | Admitting: Internal Medicine

## 2018-11-05 NOTE — Telephone Encounter (Signed)

## 2018-11-05 NOTE — Telephone Encounter (Signed)
No to all answer °

## 2018-11-06 ENCOUNTER — Other Ambulatory Visit: Payer: Self-pay

## 2018-11-06 ENCOUNTER — Encounter: Payer: Self-pay | Admitting: Internal Medicine

## 2018-11-06 ENCOUNTER — Ambulatory Visit (AMBULATORY_SURGERY_CENTER): Payer: 59 | Admitting: Internal Medicine

## 2018-11-06 VITALS — BP 112/72 | HR 52 | Temp 97.7°F | Resp 11 | Ht 72.0 in | Wt 188.0 lb

## 2018-11-06 DIAGNOSIS — Z86010 Personal history of colonic polyps: Secondary | ICD-10-CM

## 2018-11-06 DIAGNOSIS — D124 Benign neoplasm of descending colon: Secondary | ICD-10-CM

## 2018-11-06 MED ORDER — SODIUM CHLORIDE 0.9 % IV SOLN: 500.0000 mL | Freq: Once | INTRAVENOUS | Status: DC

## 2018-11-06 NOTE — Progress Notes (Signed)
Report given to PACU, vss 

## 2018-11-06 NOTE — Op Note (Signed)
[facility] Patient Name: Cody Arias Procedure Date: 11/06/2018 9:19 AM MRN: [id number] Endoscopist: Jerene Bears , MD Age: 53 Referring MD:  Date of Birth: [birth year] Gender: Male Account #: [id number] Procedure:                Colonoscopy Indications:              High risk colon cancer surveillance: Personal                            history of non-advanced adenoma, Last colonoscopy 5                            years ago Medicines:                Monitored Anesthesia Care Procedure:                Pre-Anesthesia Assessment:                           - Prior to the procedure, a History and Physical                            was performed, and patient medications and                            allergies were reviewed. The patient's tolerance of                            previous anesthesia was also reviewed. The risks                            and benefits of the procedure and the sedation                            options and risks were discussed with the patient.                            All questions were answered, and informed consent                            was obtained. Prior Anticoagulants: The patient has                            taken no previous anticoagulant or antiplatelet                            agents. ASA Grade Assessment: II - A patient with                            mild systemic disease. After reviewing the risks                            and benefits, the patient was deemed in  satisfactory condition to undergo the procedure.                           After obtaining informed consent, the colonoscope                            was passed under direct vision. Throughout the                            procedure, the patient's blood pressure, pulse, and                            oxygen saturations were monitored continuously. The                            Model PCF-[id number]) scope was introduced                      through the anus and advanced to the cecum,                            identified by appendiceal orifice and ileocecal                            valve. The colonoscopy was performed without                            difficulty. The patient tolerated the procedure                            well. The quality of the bowel preparation was                            good. The ileocecal valve, appendiceal orifice, and                            rectum were photographed. Scope In: 9:30:20 AM Scope Out: 9:47:38 AM Scope Withdrawal Time: 0 hours 13 minutes 23 seconds  Total Procedure Duration: 0 hours 17 minutes 18 seconds  Findings:                 The digital rectal exam was normal.                           A 4 mm polyp was found in the descending colon. The                            polyp was sessile. The polyp was removed with a                            cold snare. Resection and retrieval were complete.                           Multiple small-mouthed diverticula were found in  the sigmoid colon.                           Internal hemorrhoids were found during                            retroflexion. The hemorrhoids were small. Complications:            No immediate complications. Estimated Blood Loss:     Estimated blood loss was minimal. Impression:               - One 4 mm polyp in the descending colon, removed                            with a cold snare. Resected and retrieved.                           - Diverticulosis in the sigmoid colon.                           - Small internal hemorrhoids. Recommendation:           - Patient has a contact number available for                            emergencies. The signs and symptoms of potential                            delayed complications were discussed with the                            patient. Return to normal activities tomorrow.                            Written discharge  instructions were provided to the                            patient.                           - Resume previous diet.                           - Continue present medications.                           - Await pathology results.                           - Repeat colonoscopy is recommended for                            surveillance. The colonoscopy date will be                            determined after pathology results from today's  exam become available for review. Jerene Bears, MD 11/06/2018 9:50:17 AM This report has been signed electronically.

## 2018-11-06 NOTE — Patient Instructions (Signed)
One polyp removed. Diverticulosis and internal hemorrhoids noted. Dr Hilarie Fredrickson will mail you a letter in 2-3 weeks. Please read all handouts.  YOU HAD AN ENDOSCOPIC PROCEDURE TODAY AT [facility]:   Refer to the procedure report that was given to you for any specific questions about what was found during the examination.  If the procedure report does not answer your questions, please call your gastroenterologist to clarify.  If you requested that your care partner not be given the details of your procedure findings, then the procedure report has been included in a sealed envelope for you to review at your convenience later.  YOU SHOULD EXPECT: Some feelings of bloating in the abdomen. Passage of more gas than usual.  Walking can help get rid of the air that was put into your GI tract during the procedure and reduce the bloating. If you had a lower endoscopy (such as a colonoscopy or flexible sigmoidoscopy) you may notice spotting of blood in your stool or on the toilet paper. If you underwent a bowel prep for your procedure, you may not have a normal bowel movement for a few days.  Please Note:  You might notice some irritation and congestion in your nose or some drainage.  This is from the oxygen used during your procedure.  There is no need for concern and it should clear up in a day or so.  SYMPTOMS TO REPORT IMMEDIATELY:   Following lower endoscopy (colonoscopy or flexible sigmoidoscopy):  Excessive amounts of blood in the stool  Significant tenderness or worsening of abdominal pains  Swelling of the abdomen that is new, acute  Fever of 100F or higher    For urgent or emergent issues, a gastroenterologist can be reached at any hour by calling [phone].   DIET:  We do recommend a small meal at first, but then you may proceed to your regular diet.  Drink plenty of fluids but you should avoid alcoholic beverages for 24 hours.  ACTIVITY:  You should plan to take it  easy for the rest of today and you should NOT DRIVE or use heavy machinery until tomorrow (because of the sedation medicines used during the test).    FOLLOW UP: Our staff will call the number listed on your records 48-72 hours following your procedure to check on you and address any questions or concerns that you may have regarding the information given to you following your procedure. If we do not reach you, we will leave a message.  We will attempt to reach you two times.  During this call, we will ask if you have developed any symptoms of COVID 19. If you develop any symptoms (ie: fever, flu-like symptoms, shortness of breath, cough etc.) before then, please call [phone].  If you test positive for Covid 19 in the 2 weeks post procedure, please call and report this information to [place].    If any biopsies were taken you will be contacted by phone or by letter within the next 1-3 weeks.  Please call us at [phone] if you have not heard about the biopsies in 3 weeks.    SIGNATURES/CONFIDENTIALITY: You and/or your care partner have signed paperwork which will be entered into your electronic medical record.  These signatures attest to the fact that that the information above on your After Visit Summary has been reviewed and is understood.  Full responsibility of the confidentiality of this discharge information lies with you and/or your care-partner.

## 2018-11-06 NOTE — Progress Notes (Signed)
Pt's states no medical or surgical changes since previsit or office visit. 

## 2018-11-06 NOTE — Progress Notes (Signed)
Called to room to assist during endoscopic procedure.  Patient ID and intended procedure confirmed with present staff. Received instructions for my participation in the procedure from the performing physician.  

## 2018-11-06 NOTE — Progress Notes (Signed)
Riki Sheer took temp and [place] took vitals.

## 2018-11-10 ENCOUNTER — Telehealth: Payer: Self-pay | Admitting: *Deleted

## 2018-11-10 ENCOUNTER — Encounter: Payer: Self-pay | Admitting: Internal Medicine

## 2018-11-10 NOTE — Telephone Encounter (Signed)
  Follow up Call-  Call back number 11/06/2018  Post procedure Call Back phone  # [phone]  Permission to leave phone message Yes  Some recent data might be hidden     Patient questions:  Do you have a fever, pain , or abdominal swelling? No. Pain Score  0 *  Have you tolerated food without any problems? Yes.    Have you been able to return to your normal activities? Yes.    Do you have any questions about your discharge instructions: Diet   No. Medications  No. Follow up visit  No.  Do you have questions or concerns about your Care? No.  Actions: * If pain score is 4 or above: No action needed, pain <4.    1. Have you developed a fever since your procedure? no  2.   Have you had an respiratory symptoms (SOB or cough) since your procedure? no  3.   Have you tested positive for COVID 19 since your procedure no  4.   Have you had any family members/close contacts diagnosed with the COVID 19 since your procedure?  no   If yes to any of these questions please route to Joylene John, RN and Alphonsa Gin, Therapist, sports.

## 2019-01-16 ENCOUNTER — Ambulatory Visit (INDEPENDENT_AMBULATORY_CARE_PROVIDER_SITE_OTHER): Payer: 59 | Admitting: Sports Medicine

## 2019-01-16 ENCOUNTER — Encounter: Payer: Self-pay | Admitting: Sports Medicine

## 2019-01-16 ENCOUNTER — Other Ambulatory Visit: Payer: Self-pay

## 2019-01-16 VITALS — BP 114/68 | Ht 74.0 in | Wt 185.0 lb

## 2019-01-16 DIAGNOSIS — M7581 Other shoulder lesions, right shoulder: Secondary | ICD-10-CM | POA: Diagnosis not present

## 2019-01-16 MED ORDER — METHYLPREDNISOLONE ACETATE 40 MG/ML IJ SUSP
40.0000 mg | Freq: Once | INTRAMUSCULAR | Status: AC
  Administered 2019-01-16: 09:00:00 40 mg via INTRA_ARTICULAR

## 2019-01-16 NOTE — Patient Instructions (Signed)
Your pain is caused by tendinitis of your rotator cuff - The treatment for this is therapy and rehab of your shoulder.  Work on the exercises we showed you in today's clinic -The steroid injection will help reduce inflammation and decrease the amount of pain you have.  This injection takes several days to kick in.  In the meantime you may use ice on your shoulder as needed -You may take Aleve and Tylenol as needed for your pain -Avoid activities that aggravate your pain  We will see you back in 4 to 6 weeks for reevaluation.  If your pain completely resolves by that time you may call us to cancel this appointment and let us know it is no longer needed

## 2019-01-16 NOTE — Progress Notes (Addendum)
PCP: Eulas Post, MD  Subjective:   HPI: Patient is a 53 y.o. male here for evaluation of right shoulder pain.  The pain started approximately 1 month ago.  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  He notes overhead activities such as serving make his pain worse.  He also notes pain with reaching behind his back.  He denies any radiation of pain.  The pain is located on lateral shoulder.  He has no numbness or tingling, or bruising or swelling.  Patient has been taking Tylenol and ibuprofen as needed for pain which does help somewhat.  Patient does endorse night pain which started within the last several days.  Patient denies any pain in his neck.  Of note patient did have a history of labral surgery over 10 years ago of his right shoulder.   Review of Systems: See HPI above.  Past Medical History:  Diagnosis Date  . Allergy    hayfever  . Anxiety   . ASTHMA 08/26/2009   as child  . Cancer ([facility])    skin cancer   . MIGRAINE HEADACHE 08/26/2009    Current Outpatient Medications on File Prior to Visit  Medication Sig Dispense Refill  . aspirin-acetaminophen-caffeine (EXCEDRIN MIGRAINE) 250-250-65 MG tablet Take by mouth every 6 (six) hours as needed for headache.    . propranolol (INDERAL) 10 MG tablet Use as directed prn 30 tablet 1  . SUMAtriptan (IMITREX) 100 MG tablet TAKE 1 TAB EVERY 2 HOURS AS NEEDED FOR MIGRAINE MAY REPEAT IN 2 HOURS AS NEEDED MAX 2/24 HOURS 10 tablet 5   No current facility-administered medications on file prior to visit.     Past Surgical History:  Procedure Laterality Date  . COLONOSCOPY  07/15/2013  . KNEE SURGERY Left Maniscus repair/ April 2017  . peridontal  2009, 2010  . POLYPECTOMY    . SHOULDER ARTHROSCOPY W/ LABRAL REPAIR  2004  . SKIN CANCER EXCISION     basil cell x 4 (chest,back,and leg)  . VASECTOMY  2010    No Known Allergies  Social History   Socioeconomic History  . Marital  status: Married    Spouse name: Not on file  . Number of children: Not on file  . Years of education: Not on file  . Highest education level: Not on file  Occupational History  . Not on file  Social Needs  . Financial resource strain: Not on file  . Food insecurity    Worry: Not on file    Inability: Not on file  . Transportation needs    Medical: Not on file    Non-medical: Not on file  Tobacco Use  . Smoking status: Never Smoker  . Smokeless tobacco: Never Used  Substance and Sexual Activity  . Alcohol use: Yes    Alcohol/week: 3.0 standard drinks    Types: 3 Glasses of wine per week  . Drug use: No  . Sexual activity: Not on file  Lifestyle  . Physical activity    Days per week: Not on file    Minutes per session: Not on file  . Stress: Not on file  Relationships  . Social Herbalist on phone: Not on file    Gets together: Not on file    Attends religious service: Not on file    Active member of club or organization: Not on file    Attends meetings of clubs or  organizations: Not on file    Relationship status: Not on file  . Intimate partner violence    Fear of current or ex partner: Not on file    Emotionally abused: Not on file    Physically abused: Not on file    Forced sexual activity: Not on file  Other Topics Concern  . Not on file  Social History Narrative  . Not on file    Family History  Problem Relation Age of Onset  . Prostate cancer Father   . Lupus Maternal Grandfather   . Prostate cancer Paternal Grandfather   . Kidney disease Maternal Grandmother   . Colon cancer Neg Hx   . Esophageal cancer Neg Hx   . Rectal cancer Neg Hx   . Stomach cancer Neg Hx   . Colon polyps Neg Hx         Objective:  Physical Exam: BP 114/68   Ht 6\' 2"  (1.88 m)   Wt 185 lb (83.9 kg)   BMI 23.75 kg/m  Gen: NAD, comfortable in exam room Lungs: Breathing comfortably on room air Shoulder Exam right -Inspection: No discoloration, no  deformity -Palpation: No tenderness to palpation -ROM (active): Abduction: 180 degrees; Forward Flexion: 180 degrees; Internal Rotation: T10 but patient had pain with all of these movements -Strength: Abduction: 5/5; Forward Flexion: 5/5; Internal Rotation: 5/5; External Rotation: 5/5.  Strength testing reproduced pain -Special Tests: Hawkins: Positive; Neers: Negative; Jobs: Positive; O'briens: Negative; Speeds: Negative; Yergasons: Negative; Cross arm: negative -Limb neurovascularly intact, no instability noted  Contralateral Shoulder -Inspection: No discoloration, no deformity -Palpation: No tenderness to palpation -ROM (active): Abduction: 180 degrees; Forward Flexion: 180 degrees; Internal Rotation: T10 -Strength: Abduction: 5/5; Forward Flexion: 5/5; Internal Rotation: 5/5; External Rotation: 5/5 -Limb neurovascularly intact, no instability noted  Cervical Exam:  -Full range of motion with flexion, extension, lateral rotation.  -Spurlings: Negative    Assessment & Plan:  Patient is a 53 y.o. male here for evaluation of right shoulder pain  1.  Right rotator cuff tendinitis - Patient given home exercises to work on - Consent was obtained for a subacromial injection of his right shoulder.  The skin was cleaned and prepped using alcohol solution.  Ultrasound guidance was used to guide a needle into the subacromial bursa.  80 mg of Depo-Medrol and 1 cc of lidocaine were injected under ultrasound guidance and sterile technique.  Patient tolerated the injection well there were no complications -Patient may take Tylenol and Aleve as needed for pain -Patient to avoid activities that aggravate his pain such as overhead serving  Patient follow-up in 4 to 6 weeks as needed.  He will cancel his appointment if his pain is completely resolved  I was the preceptor for this visit and available for immediate consultation Shellia Cleverly, DO

## 2019-02-05 ENCOUNTER — Other Ambulatory Visit: Payer: Self-pay | Admitting: Chiropractic Medicine

## 2019-02-05 DIAGNOSIS — M25511 Pain in right shoulder: Secondary | ICD-10-CM

## 2019-02-18 ENCOUNTER — Ambulatory Visit
Admission: RE | Admit: 2019-02-18 | Discharge: 2019-02-18 | Disposition: A | Discharge status: Home / Self Care | Payer: 59 | Source: Ambulatory Visit | Attending: Chiropractic Medicine | Admitting: Chiropractic Medicine

## 2019-02-18 ENCOUNTER — Ambulatory Visit: Payer: 59 | Admitting: Sports Medicine

## 2019-02-18 ENCOUNTER — Other Ambulatory Visit: Payer: Self-pay

## 2019-02-18 DIAGNOSIS — M25511 Pain in right shoulder: Secondary | ICD-10-CM

## 2019-06-07 ENCOUNTER — Other Ambulatory Visit: Payer: Self-pay | Admitting: Family Medicine

## 2019-06-07 DIAGNOSIS — R519 Headache, unspecified: Secondary | ICD-10-CM

## 2019-06-08 NOTE — Telephone Encounter (Signed)
Refill okay?  

## 2020-01-22 ENCOUNTER — Other Ambulatory Visit: Payer: Self-pay | Admitting: Chiropractic Medicine

## 2020-01-22 DIAGNOSIS — M25561 Pain in right knee: Secondary | ICD-10-CM

## 2020-01-22 DIAGNOSIS — R609 Edema, unspecified: Secondary | ICD-10-CM

## 2020-01-22 DIAGNOSIS — M25661 Stiffness of right knee, not elsewhere classified: Secondary | ICD-10-CM

## 2020-02-12 ENCOUNTER — Ambulatory Visit
Admission: RE | Admit: 2020-02-12 | Discharge: 2020-02-12 | Disposition: A | Discharge status: Home / Self Care | Payer: 59 | Source: Ambulatory Visit | Attending: Chiropractic Medicine | Admitting: Chiropractic Medicine

## 2020-02-12 ENCOUNTER — Other Ambulatory Visit: Payer: Self-pay

## 2020-02-12 DIAGNOSIS — R609 Edema, unspecified: Secondary | ICD-10-CM

## 2020-02-12 DIAGNOSIS — M25561 Pain in right knee: Secondary | ICD-10-CM

## 2020-02-12 DIAGNOSIS — M25661 Stiffness of right knee, not elsewhere classified: Secondary | ICD-10-CM

## 2020-05-24 ENCOUNTER — Ambulatory Visit (INDEPENDENT_AMBULATORY_CARE_PROVIDER_SITE_OTHER): Payer: 59 | Admitting: Family Medicine

## 2020-05-24 ENCOUNTER — Other Ambulatory Visit: Payer: Self-pay

## 2020-05-24 ENCOUNTER — Encounter: Payer: Self-pay | Admitting: Family Medicine

## 2020-05-24 VITALS — BP 92/60 | HR 60 | Temp 97.5°F | Ht 73.5 in | Wt 204.9 lb

## 2020-05-24 DIAGNOSIS — Z0000 Encounter for general adult medical examination without abnormal findings: Secondary | ICD-10-CM | POA: Diagnosis not present

## 2020-05-24 DIAGNOSIS — Z125 Encounter for screening for malignant neoplasm of prostate: Secondary | ICD-10-CM

## 2020-05-24 LAB — CBC WITH DIFFERENTIAL/PLATELET
Basophils Absolute: 0.1 10*3/uL (ref 0.0–0.1)
Basophils Relative: 1.1 % (ref 0.0–3.0)
Eosinophils Absolute: 0.5 10*3/uL (ref 0.0–0.7)
Eosinophils Relative: 7.9 % — ABNORMAL HIGH (ref 0.0–5.0)
HCT: 44 % (ref 39.0–52.0)
Hemoglobin: 14.9 g/dL (ref 13.0–17.0)
Lymphocytes Relative: 24.3 % (ref 12.0–46.0)
Lymphs Abs: 1.6 10*3/uL (ref 0.7–4.0)
MCHC: 33.8 g/dL (ref 30.0–36.0)
MCV: 88.5 fl (ref 78.0–100.0)
Monocytes Absolute: 0.8 10*3/uL (ref 0.1–1.0)
Monocytes Relative: 12.6 % — ABNORMAL HIGH (ref 3.0–12.0)
Neutro Abs: 3.5 10*3/uL (ref 1.4–7.7)
Neutrophils Relative %: 54.1 % (ref 43.0–77.0)
Platelets: 211 10*3/uL (ref 150.0–400.0)
RBC: 4.97 Mil/uL (ref 4.22–5.81)
RDW: 14 % (ref 11.5–15.5)
WBC: 6.4 10*3/uL (ref 4.0–10.5)

## 2020-05-24 LAB — LIPID PANEL
Cholesterol: 261 mg/dL — ABNORMAL HIGH (ref 0–200)
HDL: 56.6 mg/dL (ref >39.00)
LDL Cholesterol: 182 mg/dL — ABNORMAL HIGH (ref 0–99)
NonHDL: 204
Total CHOL/HDL Ratio: 5
Triglycerides: 110 mg/dL (ref 0.0–149.0)
VLDL: 22 mg/dL (ref 0.0–40.0)

## 2020-05-24 LAB — BASIC METABOLIC PANEL
BUN: 14 mg/dL (ref 6–23)
CO2: 30 mEq/L (ref 19–32)
Calcium: 9.3 mg/dL (ref 8.4–10.5)
Chloride: 103 mEq/L (ref 96–112)
Creatinine, Ser: 1.13 mg/dL (ref 0.40–1.50)
GFR: 72.45 mL/min (ref >60.00)
Glucose, Bld: 71 mg/dL (ref 70–99)
Potassium: 4.5 mEq/L (ref 3.5–5.1)
Sodium: 138 mEq/L (ref 135–145)

## 2020-05-24 LAB — TSH: TSH: 2.44 u[IU]/mL (ref 0.35–4.50)

## 2020-05-24 LAB — HEPATIC FUNCTION PANEL
ALT: 12 U/L (ref 0–53)
AST: 19 U/L (ref 0–37)
Albumin: 4.3 g/dL (ref 3.5–5.2)
Alkaline Phosphatase: 41 U/L (ref 39–117)
Bilirubin, Direct: 0.2 mg/dL (ref 0.0–0.3)
Total Bilirubin: 1.1 mg/dL (ref 0.2–1.2)
Total Protein: 7 g/dL (ref 6.0–8.3)

## 2020-05-24 LAB — PSA: PSA: 0.87 ng/mL (ref 0.10–4.00)

## 2020-05-24 NOTE — Progress Notes (Signed)
Established Patient Office Visit  Subjective:  Patient ID: Cody Arias, male    DOB: 1965/09/28  Age: 55 y.o. MRN: [id number]  CC:  Chief Complaint  Patient presents with  . Annual Exam    HPI Cody Arias presents for physical exam.  Last physical was approximately 2 years ago.  Osaze has had some recent issues with his right shoulder and also torn meniscus.  He had right shoulder surgery couple months ago for torn rotator cuff.  Still recovering from that.  Has played tennis frequently in the past. Generally very healthy.  He has history of migraine headaches and takes Imitrex as needed.  He has history of basal cell skin cancer and is followed regularly by dermatology.  Health maintenance reviewed  -Flu vaccine already given -Covid vaccines up-to-date -Previous hepatitis C screen negative -Tetanus due 2030 -Colonoscopy due 7/27 -No history of shingles vaccine  Family history-mother had lung cancer.  She survived following lobectomy.  She does have dementia and also Parkinson's disease.  Father had prostate cancer.  He has 2 sisters who are alive and well.  Social history-married with 2 sons.  His older son was at [employer] and is taking a year off school.  Non-smoker.  The 10-year ASCVD risk score Mikey Bussing DC Brooke Bonito., et al., 2013) is: 4.2%   Values used to calculate the score:     Age: 63 years     Sex: Male     Is Non-Hispanic African American: No     Diabetic: No     Tobacco smoker: No     Systolic Blood Pressure: 92 mmHg     Is BP treated: No     HDL Cholesterol: 56.6 mg/dL     Total Cholesterol: 261 mg/dL   Past Medical History:  Diagnosis Date  . Allergy    hayfever  . Anxiety   . ASTHMA 08/26/2009   as child  . Cancer ([place])    skin cancer   . MIGRAINE HEADACHE 08/26/2009    Past Surgical History:  Procedure Laterality Date  . COLONOSCOPY  07/15/2013  . KNEE SURGERY Left Maniscus repair/ April 2017  . peridontal  2009, 2010  . POLYPECTOMY    .  SHOULDER ARTHROSCOPY W/ LABRAL REPAIR  2004  . SKIN CANCER EXCISION     basil cell x 4 (chest,back,and leg)  . VASECTOMY  2010    Family History  Problem Relation Age of Onset  . Prostate cancer Father   . Cancer Mother        lung  . Parkinson's disease Mother   . Dementia Mother   . Lupus Maternal Grandfather   . Prostate cancer Paternal Grandfather   . Kidney disease Maternal Grandmother   . Colon cancer Neg Hx   . Esophageal cancer Neg Hx   . Rectal cancer Neg Hx   . Stomach cancer Neg Hx   . Colon polyps Neg Hx     Social History   Socioeconomic History  . Marital status: Married    Spouse name: Not on file  . Number of children: Not on file  . Years of education: Not on file  . Highest education level: Not on file  Occupational History  . Not on file  Tobacco Use  . Smoking status: Never Smoker  . Smokeless tobacco: Never Used  Vaping Use  . Vaping Use: Never used  Substance and Sexual Activity  . Alcohol use: Yes    Alcohol/week: 3.0 standard  drinks    Types: 3 Glasses of wine per week  . Drug use: No  . Sexual activity: Not on file  Other Topics Concern  . Not on file  Social History Narrative  . Not on file   Social Determinants of Health   Financial Resource Strain: Not on file  Food Insecurity: Not on file  Transportation Needs: Not on file  Physical Activity: Not on file  Stress: Not on file  Social Connections: Not on file  Intimate Partner Violence: Not on file    Outpatient Medications Prior to Visit  Medication Sig Dispense Refill  . aspirin-acetaminophen-caffeine (EXCEDRIN MIGRAINE) 250-250-65 MG tablet Take by mouth every 6 (six) hours as needed for headache.    . propranolol (INDERAL) 10 MG tablet Use as directed prn 30 tablet 1  . SUMAtriptan (IMITREX) 100 MG tablet TAKE 1 TABLET BY MOUTH AS NEEDED FOR MIGRAINE, MAY REPEAT IN 2 HOURS AS NEEDED *MAX 2 TABS/24HRS* 9 tablet 4   No facility-administered medications prior to visit.     No Known Allergies  ROS Review of Systems  Constitutional: Negative for activity change, appetite change, fatigue, fever and unexpected weight change.  HENT: Negative for congestion, ear pain and trouble swallowing.        Occasional mild reflux symptoms but no dysphagia or odynophagia.  Eyes: Negative for pain and visual disturbance.  Respiratory: Negative for cough, shortness of breath and wheezing.   Cardiovascular: Negative for chest pain and palpitations.  Gastrointestinal: Negative for abdominal distention, abdominal pain, blood in stool, constipation, diarrhea, nausea, rectal pain and vomiting.  Genitourinary: Negative for dysuria, hematuria and testicular pain.  Musculoskeletal: Negative for joint swelling.  Skin: Negative for rash.  Neurological: Negative for dizziness, syncope and headaches.  Hematological: Negative for adenopathy.  Psychiatric/Behavioral: Negative for confusion and dysphoric mood.      Objective:    Physical Exam Constitutional:      General: He is not in acute distress.    Appearance: He is well-developed and well-nourished.  HENT:     Head: Normocephalic and atraumatic.     Right Ear: External ear normal.     Left Ear: External ear normal.     Mouth/Throat:     Mouth: Oropharynx is clear and moist.  Eyes:     Extraocular Movements: EOM normal.     Conjunctiva/sclera: Conjunctivae normal.     Pupils: Pupils are equal, round, and reactive to light.  Neck:     Thyroid: No thyromegaly.  Cardiovascular:     Rate and Rhythm: Normal rate and regular rhythm.     Heart sounds: Normal heart sounds. No murmur heard.   Pulmonary:     Effort: No respiratory distress.     Breath sounds: No wheezing or rales.  Abdominal:     General: Bowel sounds are normal. There is no distension.     Palpations: Abdomen is soft. There is no mass.     Tenderness: There is no abdominal tenderness. There is no guarding or rebound.  Musculoskeletal:         General: No edema.     Cervical back: Normal range of motion and neck supple.     Right lower leg: No edema.     Left lower leg: No edema.  Lymphadenopathy:     Cervical: No cervical adenopathy.  Skin:    Findings: No rash.  Neurological:     Mental Status: He is alert and oriented to person, place, and time.  Cranial Nerves: No cranial nerve deficit.     Deep Tendon Reflexes: Reflexes normal.  Psychiatric:        Mood and Affect: Mood and affect normal.     BP 92/60 (BP Location: Left Arm, Patient Position: Sitting, Cuff Size: Large)   Pulse 60   Temp (!) 97.5 F (36.4 C) (Oral)   Ht 6' 1.5" (1.867 m)   Wt 204 lb 14.4 oz (92.9 kg)   SpO2 98%   BMI 26.67 kg/m  Wt Readings from Last 3 Encounters:  05/24/20 204 lb 14.4 oz (92.9 kg)  01/16/19 185 lb (83.9 kg)  11/06/18 188 lb (85.3 kg)     There are no preventive care reminders to display for this patient.  There are no preventive care reminders to display for this patient.  Lab Results  Component Value Date   TSH 1.45 06/11/2018   Lab Results  Component Value Date   WBC 5.9 06/11/2018   HGB 14.6 06/11/2018   HCT 44.3 06/11/2018   MCV 87.7 06/11/2018   PLT 197.0 06/11/2018   Lab Results  Component Value Date   NA 139 06/11/2018   K 5.0 06/11/2018   CO2 31 06/11/2018   GLUCOSE 77 06/11/2018   BUN 21 06/11/2018   CREATININE 1.11 06/11/2018   BILITOT 0.8 06/11/2018   ALKPHOS 37 (L) 06/11/2018   AST 17 06/11/2018   ALT 12 06/11/2018   PROT 6.8 06/11/2018   ALBUMIN 4.1 06/11/2018   CALCIUM 9.3 06/11/2018   GFR 68.79 06/11/2018   Lab Results  Component Value Date   CHOL 209 (H) 06/11/2018   Lab Results  Component Value Date   HDL 58.90 06/11/2018   Lab Results  Component Value Date   LDLCALC 132 (H) 06/11/2018   Lab Results  Component Value Date   TRIG 91.0 06/11/2018   Lab Results  Component Value Date   CHOLHDL 4 06/11/2018   No results found for: HGBA1C    Assessment & Plan:    Problem List Items Addressed This Visit   None   Visit Diagnoses    Physical exam    -  Primary   Relevant Orders   Basic metabolic panel   Lipid panel   CBC with Differential/Platelet   TSH   Hepatic function panel   PSA    -We discussed Shingrix vaccine and he will consider getting this at some point later this year. -Obtain screening labs. -Continue regular exercise habits -We encouraged him to continue with yearly dermatology follow-up given his high risk skin type  No orders of the defined types were placed in this encounter.   Follow-up: No follow-ups on file.    Carolann Littler, MD

## 2020-05-24 NOTE — Patient Instructions (Addendum)

## 2020-05-25 ENCOUNTER — Telehealth: Payer: Self-pay | Admitting: Family Medicine

## 2020-05-25 ENCOUNTER — Telehealth: Payer: Self-pay

## 2020-05-25 DIAGNOSIS — R519 Headache, unspecified: Secondary | ICD-10-CM

## 2020-05-25 MED ORDER — SUMATRIPTAN SUCCINATE 100 MG PO TABS: ORAL_TABLET | ORAL | 4 refills | Status: DC

## 2020-05-25 NOTE — Telephone Encounter (Signed)
Pt return your call and want a call back.

## 2020-05-25 NOTE — Telephone Encounter (Signed)
-----   Message from Eulas Post, MD sent at 05/25/2020  7:41 AM EST ----- Labs all look good except for elevated lipids.  However, his 10-year risk for CAD is only 4.2% which is below American Heart Association standard of 7.5% for initiating statin.  Recommend focusing on establishing more consistent exercise, weight loss, and reducing saturated fats.

## 2020-05-25 NOTE — Telephone Encounter (Signed)
Left message for patient to call back to discuss results and recommendations.

## 2020-05-25 NOTE — Telephone Encounter (Signed)
Informed patient of results and recommendations.   Patient was advised he snores and he would like to know what to do?

## 2020-05-26 NOTE — Telephone Encounter (Signed)
Snoring, by itself as an isolated symptom, is very common and not worrisome    However, if there has been observed apnea episodes at night (eg observed by wife) and especially if there is significant daytime somnolence, then would consider sleep study to rule out apnea    if any nasal congestion would be using Flonase(OTC) nightly.

## 2020-05-27 NOTE — Telephone Encounter (Signed)
Spoke with pt about message.  Verbalize understanding.

## 2020-07-27 ENCOUNTER — Ambulatory Visit (INDEPENDENT_AMBULATORY_CARE_PROVIDER_SITE_OTHER): Payer: 59 | Admitting: Family Medicine

## 2020-07-27 ENCOUNTER — Encounter: Payer: Self-pay | Admitting: Family Medicine

## 2020-07-27 ENCOUNTER — Other Ambulatory Visit: Payer: Self-pay

## 2020-07-27 VITALS — BP 132/64 | HR 66 | Temp 98.6°F | Wt 205.0 lb

## 2020-07-27 DIAGNOSIS — S8012XA Contusion of left lower leg, initial encounter: Secondary | ICD-10-CM

## 2020-07-27 NOTE — Progress Notes (Signed)
Established Patient Office Visit  Subjective:  Patient ID: Cody Arias, male    DOB: [birth year]  Age: 55 y.o. MRN: [id number]  CC:  Chief Complaint  Patient presents with  . Knee Injury    [place] [address] out of a truck, x 2 weeks, L leg/ knee is swollen and painful    HPI Cody Arias presents for left lower extremity injury.  This occurred about 2 weeks ago.  He was getting out of a truck and basically his right foot slipped off the bumper and he went down toward the ground.  When this happened his left posterior knee and calf region hit against the top of the tailgate.  He had some bruising afterwards around the proximal calf region and since then has had some dependent edema all the way down to the foot.  Has had some bruising all the way down the foot.  No difficulty with bearing weight.  No pain with ambulation.  No weakness with plantar flexion or dorsiflexion.  No knee pain.  No knee effusion.  He had concerned because he is flying with family to [place] later this week.  He was concerned mostly because of the edema issues.  Past Medical History:  Diagnosis Date  . Allergy    hayfever  . Anxiety   . ASTHMA 08/26/2009   as child  . Cancer ([place])    skin cancer   . MIGRAINE HEADACHE 08/26/2009    Past Surgical History:  Procedure Laterality Date  . COLONOSCOPY  07/15/2013  . KNEE SURGERY Left Maniscus repair/ April 2017  . peridontal  2009, 2010  . POLYPECTOMY    . SHOULDER ARTHROSCOPY W/ LABRAL REPAIR  2004  . SKIN CANCER EXCISION     basil cell x 4 (chest,back,and leg)  . VASECTOMY  2010    Family History  Problem Relation Age of Onset  . Prostate cancer Father   . Cancer Mother        lung  . Parkinson's disease Mother   . Dementia Mother   . Lupus Maternal Grandfather   . Prostate cancer Paternal Grandfather   . Kidney disease Maternal Grandmother   . Colon cancer Neg Hx   . Esophageal cancer Neg Hx   . Rectal cancer Neg Hx   . Stomach cancer Neg Hx   .  Colon polyps Neg Hx     Social History   Socioeconomic History  . Marital status: Married    Spouse name: Not on file  . Number of children: Not on file  . Years of education: Not on file  . Highest education level: Not on file  Occupational History  . Not on file  Tobacco Use  . Smoking status: Never Smoker  . Smokeless tobacco: Never Used  Vaping Use  . Vaping Use: Never used  Substance and Sexual Activity  . Alcohol use: Yes    Alcohol/week: 3.0 standard drinks    Types: 3 Glasses of wine per week  . Drug use: No  . Sexual activity: Not on file  Other Topics Concern  . Not on file  Social History Narrative  . Not on file   Social Determinants of Health   Financial Resource Strain: Not on file  Food Insecurity: Not on file  Transportation Needs: Not on file  Physical Activity: Not on file  Stress: Not on file  Social Connections: Not on file  Intimate Partner Violence: Not on file    Outpatient Medications Prior  to Visit  Medication Sig Dispense Refill  . aspirin-acetaminophen-caffeine (EXCEDRIN MIGRAINE) 250-250-65 MG tablet Take by mouth every 6 (six) hours as needed for headache.    . propranolol (INDERAL) 10 MG tablet Use as directed prn 30 tablet 1  . SUMAtriptan (IMITREX) 100 MG tablet TAKE 1 TABLET BY MOUTH AS NEEDED FOR MIGRAINE, MAY REPEAT IN 2 HOURS AS NEEDED *MAX 2 TABS/24HRS* 9 tablet 4   No facility-administered medications prior to visit.    No Known Allergies  ROS Review of Systems  Neurological: Negative for weakness and numbness.      Objective:    Physical Exam Vitals reviewed.  Constitutional:      Appearance: Normal appearance.  Cardiovascular:     Rate and Rhythm: Normal rate and regular rhythm.  Musculoskeletal:     Comments: Left knee reveals no effusion.  Full range of motion.  No medial or lateral joint line tenderness.  He has some mild faint bruising involving the left lower extremity all the way from the calf region down  toward the ankle.  He has some mild edema lower leg and foot and ankle.  He has full range of motion in the ankle.  No Achilles tenderness.  No direct tenderness to palpation posterior knee or proximal calf region.  Full strength with plantarflexion and dorsiflexion.  Neurological:     Mental Status: He is alert.     BP 132/64 (BP Location: Left Arm, Patient Position: Sitting, Cuff Size: Normal)   Pulse 66   Temp 98.6 F (37 C) (Oral)   Wt 205 lb (93 kg)   SpO2 97%   BMI 26.68 kg/m  Wt Readings from Last 3 Encounters:  07/27/20 205 lb (93 kg)  05/24/20 204 lb 14.4 oz (92.9 kg)  01/16/19 185 lb (83.9 kg)     There are no preventive care reminders to display for this patient.  There are no preventive care reminders to display for this patient.  Lab Results  Component Value Date   TSH 2.44 05/24/2020   Lab Results  Component Value Date   WBC 6.4 05/24/2020   HGB 14.9 05/24/2020   HCT 44.0 05/24/2020   MCV 88.5 05/24/2020   PLT 211.0 05/24/2020   Lab Results  Component Value Date   NA 138 05/24/2020   K 4.5 05/24/2020   CO2 30 05/24/2020   GLUCOSE 71 05/24/2020   BUN 14 05/24/2020   CREATININE 1.13 05/24/2020   BILITOT 1.1 05/24/2020   ALKPHOS 41 05/24/2020   AST 19 05/24/2020   ALT 12 05/24/2020   PROT 7.0 05/24/2020   ALBUMIN 4.3 05/24/2020   CALCIUM 9.3 05/24/2020   GFR 72.45 05/24/2020   Lab Results  Component Value Date   CHOL 261 (H) 05/24/2020   Lab Results  Component Value Date   HDL 56.60 05/24/2020   Lab Results  Component Value Date   LDLCALC 182 (H) 05/24/2020   Lab Results  Component Value Date   TRIG 110.0 05/24/2020   Lab Results  Component Value Date   CHOLHDL 5 05/24/2020   No results found for: HGBA1C    Assessment & Plan:   Contusion left lower extremity involving the proximal gastrocnemius region.  He has some dependent edema related to bruising which is relatively mild.  No suspicion of bony injury.  -Reassurance.   Suspect edema will improve gradually over the next couple weeks. -Continue usual activities as tolerated -Frequent leg elevation -Did not see any contraindications to him flying  at this time   No orders of the defined types were placed in this encounter.   Follow-up: No follow-ups on file.    Carolann Littler, MD

## 2020-07-27 NOTE — Patient Instructions (Signed)
Contusion A contusion is a deep bruise. Contusions are the result of a blunt injury to tissues and muscle fibers under the skin. The injury causes bleeding under the skin. The skin overlying the contusion may turn blue, purple, or yellow. Minor injuries will give you a painless contusion, but more severe injuries cause contusions that may stay painful and swollen for a few weeks. Follow these instructions at home: Pay attention to any changes in your symptoms. Let your health care provider know about them. Take these actions to relieve your pain. Managing pain, stiffness, and swelling  Use resting, icing, applying pressure (compression), and raising (elevating) the injured area. This is often called the RICE strategy. ? Rest the injured area. Return to your normal activities as told by your health care provider. Ask your health care provider what activities are safe for you. ? If directed, put ice on the injured area:  Put ice in a plastic bag.  Place a towel between your skin and the bag.  Leave the ice on for 20 minutes, 2-3 times per day. ? If directed, apply light compression to the injured area using an elastic bandage. Make sure the bandage is not wrapped too tightly. Remove and reapply the bandage as directed by your health care provider. ? If possible, raise (elevate) the injured area above the level of your heart while you are sitting or lying down.   General instructions  Take over-the-counter and prescription medicines only as told by your health care provider.  Keep all follow-up visits as told by your health care provider. This is important. Contact a health care provider if:  Your symptoms do not improve after several days of treatment.  Your symptoms get worse.  You have difficulty moving the injured area. Get help right away if:  You have severe pain.  You have numbness in a hand or foot.  Your hand or foot turns pale or cold. Summary  A contusion is a deep  bruise.  Contusions are the result of a blunt injury to tissues and muscle fibers under the skin.  It is treated with rest, ice, compression, and elevation. You may be given over-the-counter medicines for pain.  Contact a health care provider if your symptoms do not improve, or get worse.  Get help right away if you have severe pain, have numbness, or the area turns pale or cold. This information is not intended to replace advice given to you by your health care provider. Make sure you discuss any questions you have with your health care provider. Document Revised: 11/21/2017 Document Reviewed: 11/21/2017 Elsevier Patient Education  [place].

## 2021-01-18 ENCOUNTER — Other Ambulatory Visit: Payer: Self-pay | Admitting: Family Medicine

## 2021-01-18 DIAGNOSIS — R519 Headache, unspecified: Secondary | ICD-10-CM

## 2021-02-22 ENCOUNTER — Other Ambulatory Visit: Payer: Self-pay

## 2021-02-22 ENCOUNTER — Ambulatory Visit (INDEPENDENT_AMBULATORY_CARE_PROVIDER_SITE_OTHER): Payer: 59 | Admitting: Family Medicine

## 2021-02-22 VITALS — BP 120/58 | HR 66 | Temp 97.6°F | Wt 204.5 lb

## 2021-02-22 DIAGNOSIS — G43909 Migraine, unspecified, not intractable, without status migrainosus: Secondary | ICD-10-CM | POA: Diagnosis not present

## 2021-02-22 MED ORDER — AMITRIPTYLINE HCL 10 MG PO TABS: ORAL_TABLET | ORAL | 2 refills | Status: DC

## 2021-02-22 NOTE — Progress Notes (Signed)
Established Patient Office Visit  Subjective:  Patient ID: Cody Arias, male    DOB: 1966-04-05  Age: 55 y.o. MRN: [id number]  CC:  Chief Complaint  Patient presents with   Follow-up    HPI Cody Arias presents for concerns for possible prophylactic medications for headaches.  He has known history of migraine headaches and generally gets prompt relief with Imitrex.  He has had some recent progression in headaches in terms of frequency.  His concern is that he is having to take his Imitrex more frequently.  He can have as many as 4 headaches per week.  Headaches can vary somewhat in location but are frequently frontal location.  Frequently has throbbing quality and sometimes has associated nausea.  Usually combination of Imitrex and sometimes Excedrin Migraine relief.  He has noted triggers including changes in barometric pressure, dehydration, and beers containing increased hops.  Denies any acute neurologic concerns otherwise.  He has had occasional visual aura in the past.  Does have occasional sleep disturbance.  No recent reported appetite or weight changes.  Past Medical History:  Diagnosis Date   Allergy    hayfever   Anxiety    ASTHMA 08/26/2009   as child   Cancer [facility])    skin cancer    MIGRAINE HEADACHE 08/26/2009    Past Surgical History:  Procedure Laterality Date   COLONOSCOPY  07/15/2013   KNEE SURGERY Left Maniscus repair/ April 2017   peridontal  2009, 2010   POLYPECTOMY     SHOULDER ARTHROSCOPY W/ LABRAL REPAIR  2004   SKIN CANCER EXCISION     basil cell x 4 (chest,back,and leg)   VASECTOMY  2010    Family History  Problem Relation Age of Onset   Prostate cancer Father    Cancer Mother        lung   Parkinson's disease Mother    Dementia Mother    Lupus Maternal Grandfather    Prostate cancer Paternal Grandfather    Kidney disease Maternal Grandmother    Colon cancer Neg Hx    Esophageal cancer Neg Hx    Rectal cancer Neg Hx    Stomach cancer Neg  Hx    Colon polyps Neg Hx     Social History   Socioeconomic History   Marital status: Married    Spouse name: Not on file   Number of children: Not on file   Years of education: Not on file   Highest education level: Not on file  Occupational History   Not on file  Tobacco Use   Smoking status: Never   Smokeless tobacco: Never  Vaping Use   Vaping Use: Never used  Substance and Sexual Activity   Alcohol use: Yes    Alcohol/week: 3.0 standard drinks    Types: 3 Glasses of wine per week   Drug use: No   Sexual activity: Not on file  Other Topics Concern   Not on file  Social History Narrative   Not on file   Social Determinants of Health   Financial Resource Strain: Not on file  Food Insecurity: Not on file  Transportation Needs: Not on file  Physical Activity: Not on file  Stress: Not on file  Social Connections: Not on file  Intimate Partner Violence: Not on file    Outpatient Medications Prior to Visit  Medication Sig Dispense Refill   aspirin-acetaminophen-caffeine (EXCEDRIN MIGRAINE) 250-250-65 MG tablet Take by mouth every 6 (six) hours as needed for  headache.     propranolol (INDERAL) 10 MG tablet Use as directed prn 30 tablet 1   SUMAtriptan (IMITREX) 100 MG tablet TAKE 1 TABLET BY MOUTH AS NEEDED FOR MIGRAINE, MAY REPEAT IN 2 HOURS AS NEEDED *MAX 2 TABS/24HRS* 9 tablet 4   No facility-administered medications prior to visit.    No Known Allergies  ROS Review of Systems  Constitutional:  Negative for chills and fever.  Respiratory:  Negative for cough and shortness of breath.   Neurological:  Positive for headaches. Negative for dizziness, seizures, syncope and speech difficulty.  Psychiatric/Behavioral:  Negative for confusion.      Objective:    Physical Exam Vitals reviewed.  Constitutional:      Appearance: Normal appearance.  HENT:     Head: Normocephalic and atraumatic.  Eyes:     Extraocular Movements: Extraocular movements intact.      Pupils: Pupils are equal, round, and reactive to light.  Cardiovascular:     Rate and Rhythm: Normal rate and regular rhythm.  Musculoskeletal:     Cervical back: Neck supple.  Neurological:     General: No focal deficit present.     Mental Status: He is alert and oriented to person, place, and time.     Cranial Nerves: No cranial nerve deficit.    BP (!) 120/58 (BP Location: Left Arm, Patient Position: Sitting, Cuff Size: Normal)   Pulse 66   Temp 97.6 F (36.4 C) (Oral)   Wt 204 lb 8 oz (92.8 kg)   SpO2 99%   BMI 26.61 kg/m  Wt Readings from Last 3 Encounters:  02/22/21 204 lb 8 oz (92.8 kg)  07/27/20 205 lb (93 kg)  05/24/20 204 lb 14.4 oz (92.9 kg)     Health Maintenance Due  Topic Date Due   Zoster Vaccines- Shingrix (1 of 2) Never done   COVID-19 Vaccine (4 - Booster for Pfizer series) 04/11/2020    There are no preventive care reminders to display for this patient.  Lab Results  Component Value Date   TSH 2.44 05/24/2020   Lab Results  Component Value Date   WBC 6.4 05/24/2020   HGB 14.9 05/24/2020   HCT 44.0 05/24/2020   MCV 88.5 05/24/2020   PLT 211.0 05/24/2020   Lab Results  Component Value Date   NA 138 05/24/2020   K 4.5 05/24/2020   CO2 30 05/24/2020   GLUCOSE 71 05/24/2020   BUN 14 05/24/2020   CREATININE 1.13 05/24/2020   BILITOT 1.1 05/24/2020   ALKPHOS 41 05/24/2020   AST 19 05/24/2020   ALT 12 05/24/2020   PROT 7.0 05/24/2020   ALBUMIN 4.3 05/24/2020   CALCIUM 9.3 05/24/2020   GFR 72.45 05/24/2020   Lab Results  Component Value Date   CHOL 261 (H) 05/24/2020   Lab Results  Component Value Date   HDL 56.60 05/24/2020   Lab Results  Component Value Date   LDLCALC 182 (H) 05/24/2020   Lab Results  Component Value Date   TRIG 110.0 05/24/2020   Lab Results  Component Value Date   CHOLHDL 5 05/24/2020   No results found for: HGBA1C    Assessment & Plan:   Migraine headaches.  Recent increased frequency.  He  generally gets good relief with Imitrex but requiring taking this more frequently.  Consistently greater than 4 migraine headaches per month  -Discussed avoidance of triggers -Discussed avoidance of regular use of analgesics to avoid analgesic withdrawal headache -We discussed multiple prophylactic  options.  We discussed preventative medications including beta-blockers, verapamil, Effexor, tricyclic's, Topamax.  He does have some intermittent sleep disturbance and we discussed trial of amitriptyline starting 10 mg nightly and after 1 week titrate to 20 mg.  Reviewed potential side effects. -Set up 1 month follow-up. -Continue Imitrex as needed for acute relief as this seems to working relatively well  Meds ordered this encounter  Medications   amitriptyline (ELAVIL) 10 MG tablet    Sig: Take one tablet at night for one week and then increase to two tablets at night (for migraine prevention)    Dispense:  60 tablet    Refill:  2    Follow-up: Return in about 1 month (around 03/24/2021).    Carolann Littler, MD

## 2021-03-16 ENCOUNTER — Other Ambulatory Visit: Payer: Self-pay | Admitting: Family Medicine

## 2021-03-17 ENCOUNTER — Other Ambulatory Visit: Payer: Self-pay

## 2021-03-27 ENCOUNTER — Ambulatory Visit (INDEPENDENT_AMBULATORY_CARE_PROVIDER_SITE_OTHER): Payer: 59 | Admitting: Family Medicine

## 2021-03-27 VITALS — BP 120/68 | HR 75 | Temp 98.2°F | Wt 210.9 lb

## 2021-03-27 DIAGNOSIS — G43909 Migraine, unspecified, not intractable, without status migrainosus: Secondary | ICD-10-CM | POA: Diagnosis not present

## 2021-03-27 NOTE — Patient Instructions (Signed)
Increase the Amitriptyline 10 mg to three tablet every night  Give me some feedback in 3-4 weeks.

## 2021-03-27 NOTE — Progress Notes (Signed)
Established Patient Office Visit  Subjective:  Patient ID: Cody Arias, male    DOB: Feb 16, 1966  Age: 55 y.o. MRN: [id number]  CC:  Chief Complaint  Patient presents with   Follow-up    HPI Cody Arias presents for follow-up regarding headaches.  He has history of frequent migraines and was having at least 4/month and sometimes several more.  Probably has some overlap with chronic tension type headaches and history of chronic sleep disturbance.  We recently started low-dose amitriptyline 10 mg nightly and currently taking 20 mg.  He has seen some reduction with only 1 migraine this past Saturday.  Still having some relatively mild tension type headaches but overall frequency is improved.  He is sleeping better.  Minimal dry mouth.  No constipation.  Past Medical History:  Diagnosis Date   Allergy    hayfever   Anxiety    ASTHMA 08/26/2009   as child   Cancer [facility])    skin cancer    MIGRAINE HEADACHE 08/26/2009    Past Surgical History:  Procedure Laterality Date   COLONOSCOPY  07/15/2013   KNEE SURGERY Left Maniscus repair/ April 2017   peridontal  2009, 2010   POLYPECTOMY     SHOULDER ARTHROSCOPY W/ LABRAL REPAIR  2004   SKIN CANCER EXCISION     basil cell x 4 (chest,back,and leg)   VASECTOMY  2010    Family History  Problem Relation Age of Onset   Prostate cancer Father    Cancer Mother        lung   Parkinson's disease Mother    Dementia Mother    Lupus Maternal Grandfather    Prostate cancer Paternal Grandfather    Kidney disease Maternal Grandmother    Colon cancer Neg Hx    Esophageal cancer Neg Hx    Rectal cancer Neg Hx    Stomach cancer Neg Hx    Colon polyps Neg Hx     Social History   Socioeconomic History   Marital status: Married    Spouse name: Not on file   Number of children: Not on file   Years of education: Not on file   Highest education level: Not on file  Occupational History   Not on file  Tobacco Use   Smoking status: Never    Smokeless tobacco: Never  Vaping Use   Vaping Use: Never used  Substance and Sexual Activity   Alcohol use: Yes    Alcohol/week: 3.0 standard drinks    Types: 3 Glasses of wine per week   Drug use: No   Sexual activity: Not on file  Other Topics Concern   Not on file  Social History Narrative   Not on file   Social Determinants of Health   Financial Resource Strain: Not on file  Food Insecurity: Not on file  Transportation Needs: Not on file  Physical Activity: Not on file  Stress: Not on file  Social Connections: Not on file  Intimate Partner Violence: Not on file    Outpatient Medications Prior to Visit  Medication Sig Dispense Refill   amitriptyline (ELAVIL) 10 MG tablet TAKE 1 TABLET AT NIGHT FOR ONE WEEK AND THEN INCREASE TO 2 TABLETS AT NIGHT (FOR MIGRAINE PREVENTION 180 tablet 0   aspirin-acetaminophen-caffeine (EXCEDRIN MIGRAINE) 250-250-65 MG tablet Take by mouth every 6 (six) hours as needed for headache.     propranolol (INDERAL) 10 MG tablet Use as directed prn 30 tablet 1   SUMAtriptan (IMITREX)  100 MG tablet TAKE 1 TABLET BY MOUTH AS NEEDED FOR MIGRAINE, MAY REPEAT IN 2 HOURS AS NEEDED *MAX 2 TABS/24HRS* 9 tablet 4   No facility-administered medications prior to visit.    No Known Allergies  ROS Review of Systems  Gastrointestinal:  Negative for constipation.  Neurological:  Positive for headaches. Negative for dizziness.     Objective:    Physical Exam Vitals reviewed.  Constitutional:      Appearance: Normal appearance.  Cardiovascular:     Rate and Rhythm: Normal rate and regular rhythm.  Neurological:     Mental Status: He is alert.    BP 120/68 (BP Location: Left Arm, Patient Position: Sitting, Cuff Size: Normal)   Pulse 75   Temp 98.2 F (36.8 C) (Oral)   Wt 210 lb 14.4 oz (95.7 kg)   SpO2 97%   BMI 27.45 kg/m  Wt Readings from Last 3 Encounters:  03/27/21 210 lb 14.4 oz (95.7 kg)  02/22/21 204 lb 8 oz (92.8 kg)  07/27/20 205 lb  (93 kg)     Health Maintenance Due  Topic Date Due   Zoster Vaccines- Shingrix (1 of 2) Never done   COVID-19 Vaccine (4 - Booster for Pfizer series) 04/11/2020    There are no preventive care reminders to display for this patient.  Lab Results  Component Value Date   TSH 2.44 05/24/2020   Lab Results  Component Value Date   WBC 6.4 05/24/2020   HGB 14.9 05/24/2020   HCT 44.0 05/24/2020   MCV 88.5 05/24/2020   PLT 211.0 05/24/2020   Lab Results  Component Value Date   NA 138 05/24/2020   K 4.5 05/24/2020   CO2 30 05/24/2020   GLUCOSE 71 05/24/2020   BUN 14 05/24/2020   CREATININE 1.13 05/24/2020   BILITOT 1.1 05/24/2020   ALKPHOS 41 05/24/2020   AST 19 05/24/2020   ALT 12 05/24/2020   PROT 7.0 05/24/2020   ALBUMIN 4.3 05/24/2020   CALCIUM 9.3 05/24/2020   GFR 72.45 05/24/2020   Lab Results  Component Value Date   CHOL 261 (H) 05/24/2020   Lab Results  Component Value Date   HDL 56.60 05/24/2020   Lab Results  Component Value Date   LDLCALC 182 (H) 05/24/2020   Lab Results  Component Value Date   TRIG 110.0 05/24/2020   Lab Results  Component Value Date   CHOLHDL 5 05/24/2020   No results found for: HGBA1C    Assessment & Plan:   Problem List Items Addressed This Visit       Unprioritized   Migraine headache - Primary  Probably has mixed headaches with migraine and tension components.  We discussed further titration of amitriptyline to 30 mg nightly.  If he is not seeing additional improvement over the next few weeks be in touch.  We could consider low-dose Topamax as another alternative or add on  No orders of the defined types were placed in this encounter.   Follow-up: No follow-ups on file.    Carolann Littler, MD

## 2021-04-18 ENCOUNTER — Telehealth: Payer: Self-pay | Admitting: Family Medicine

## 2021-04-18 NOTE — Telephone Encounter (Signed)
Pt is calling and he was told to increase amitriptyline (ELAVIL) 10 MG tablet . Pt take 3 pills 30 mg at night and needs new rx  [id number] [address], [place] Phone:  [phone]  Fax:  [phone]

## 2021-04-24 MED ORDER — AMITRIPTYLINE HCL 10 MG PO TABS: ORAL_TABLET | ORAL | 3 refills | Status: DC

## 2021-04-24 NOTE — Telephone Encounter (Signed)
RX sent in

## 2021-06-06 ENCOUNTER — Telehealth: Payer: Self-pay

## 2021-06-06 ENCOUNTER — Ambulatory Visit (INDEPENDENT_AMBULATORY_CARE_PROVIDER_SITE_OTHER): Payer: 59 | Admitting: Orthopaedic Surgery

## 2021-06-06 ENCOUNTER — Ambulatory Visit: Payer: Self-pay

## 2021-06-06 ENCOUNTER — Encounter: Payer: Self-pay | Admitting: Orthopaedic Surgery

## 2021-06-06 ENCOUNTER — Other Ambulatory Visit: Payer: Self-pay

## 2021-06-06 DIAGNOSIS — G8929 Other chronic pain: Secondary | ICD-10-CM | POA: Diagnosis not present

## 2021-06-06 DIAGNOSIS — M545 Low back pain, unspecified: Secondary | ICD-10-CM | POA: Diagnosis not present

## 2021-06-06 MED ORDER — METAXALONE 800 MG PO TABS: 800.0000 mg | ORAL_TABLET | Freq: Three times a day (TID) | ORAL | 6 refills | Status: DC

## 2021-06-06 MED ORDER — METHOCARBAMOL 750 MG PO TABS: 750.0000 mg | ORAL_TABLET | Freq: Two times a day (BID) | ORAL | 3 refills | Status: DC | PRN

## 2021-06-06 MED ORDER — DICLOFENAC SODIUM 75 MG PO TBEC: 75.0000 mg | DELAYED_RELEASE_TABLET | Freq: Two times a day (BID) | ORAL | 2 refills | Status: DC

## 2021-06-06 MED ORDER — PREDNISONE 10 MG (21) PO TBPK: ORAL_TABLET | ORAL | 3 refills | Status: DC

## 2021-06-06 NOTE — Telephone Encounter (Signed)
CD placed in mail 2/21 to address in chart.

## 2021-06-06 NOTE — Telephone Encounter (Signed)
Per Dr. Erlinda Hong   Can we put xrays that were done today on a CD and mail to patient. Please. Thank you.

## 2021-06-06 NOTE — Progress Notes (Signed)
Office Visit Note   Patient: Cody Arias           Date of Birth: 1966-04-05           MRN: [id number] Visit Date: 06/06/2021              Requested by: Eulas Post, MD [place],  [place] [address] PCP: Eulas Post, MD   Assessment & Plan: Visit Diagnoses:  1. Chronic bilateral low back pain, unspecified whether sciatica present     Plan: Impression is acute lumbar strain quadratus syndrome.  I recommend using heat and continuing with a lumbar brace.  Back off on activities.  Prescription for prednisone diclofenac and Robaxin.  Follow-Up Instructions: No follow-ups on file.   Orders:  Orders Placed This Encounter  Procedures   XR Lumbar Spine 2-3 Views   Meds ordered this encounter  Medications   predniSONE (STERAPRED UNI-PAK 21 TAB) 10 MG (21) TBPK tablet    Sig: Take as directed    Dispense:  21 tablet    Refill:  3   diclofenac (VOLTAREN) 75 MG EC tablet    Sig: Take 1 tablet (75 mg total) by mouth 2 (two) times daily.    Dispense:  30 tablet    Refill:  2   metaxalone (SKELAXIN) 800 MG tablet    Sig: Take 1 tablet (800 mg total) by mouth 3 (three) times daily.    Dispense:  30 tablet    Refill:  6   methocarbamol (ROBAXIN) 750 MG tablet    Sig: Take 1 tablet (750 mg total) by mouth 2 (two) times daily as needed for muscle spasms.    Dispense:  20 tablet    Refill:  3      Procedures: No procedures performed   Clinical Data: No additional findings.   Subjective: Chief Complaint  Patient presents with   Lower Back - Pain    HPI  Cody Arias is a 56 year old gentleman who comes in today for evaluation of acute low back pain since Sunday while doing a workout.  He was lifting a medicine ball from the floor and had immediate pain and had to drop the ball.  Denies any radicular symptoms or red flag symptoms.  The pain is better with standing and worse in the morning after sitting.  Review of Systems  Constitutional: Negative.    All other systems reviewed and are negative.   Objective: Vital Signs: There were no vitals taken for this visit.  Physical Exam Vitals and nursing note reviewed.  Constitutional:      Appearance: He is well-developed.  HENT:     Head: Normocephalic and atraumatic.  Eyes:     Pupils: Pupils are equal, round, and reactive to light.  Pulmonary:     Effort: Pulmonary effort is normal.  Abdominal:     Palpations: Abdomen is soft.  Musculoskeletal:        General: Normal range of motion.     Cervical back: Neck supple.  Skin:    General: Skin is warm.  Neurological:     Mental Status: He is alert and oriented to person, place, and time.  Psychiatric:        Behavior: Behavior normal.        Thought Content: Thought content normal.        Judgment: Judgment normal.    Ortho Exam  Examination of the lumbar spine shows no midline tenderness.  Range of motion  is limited secondary to pain and guarding.  No focal deficits in the lower extremities.  Specialty Comments:  No specialty comments available.  Imaging: XR Lumbar Spine 2-3 Views  Result Date: 06/06/2021 No acute or structural abnormalities.    PMFS History: Patient Active Problem List   Diagnosis Date Noted   S/P arthroscopy of knee 08/02/2015   Tear of meniscus of knee 07/14/2015   Premature ejaculation 05/29/2013   Migraine headache 08/26/2009   ALLERGIC RHINITIS 08/26/2009   ASTHMA 08/26/2009   Headache, unspecified headache type 08/26/2009   Past Medical History:  Diagnosis Date   Allergy    hayfever   Anxiety    ASTHMA 08/26/2009   as child   Cancer [facility])    skin cancer    MIGRAINE HEADACHE 08/26/2009    Family History  Problem Relation Age of Onset   Prostate cancer Father    Cancer Mother        lung   Parkinson's disease Mother    Dementia Mother    Lupus Maternal Grandfather    Prostate cancer Paternal Grandfather    Kidney disease Maternal Grandmother    Colon cancer Neg Hx     Esophageal cancer Neg Hx    Rectal cancer Neg Hx    Stomach cancer Neg Hx    Colon polyps Neg Hx     Past Surgical History:  Procedure Laterality Date   COLONOSCOPY  07/15/2013   KNEE SURGERY Left Maniscus repair/ April 2017   peridontal  2009, 2010   POLYPECTOMY     SHOULDER ARTHROSCOPY W/ LABRAL REPAIR  2004   SKIN CANCER EXCISION     basil cell x 4 (chest,back,and leg)   VASECTOMY  2010   Social History   Occupational History   Not on file  Tobacco Use   Smoking status: Never   Smokeless tobacco: Never  Vaping Use   Vaping Use: Never used  Substance and Sexual Activity   Alcohol use: Yes    Alcohol/week: 3.0 standard drinks    Types: 3 Glasses of wine per week   Drug use: No   Sexual activity: Not on file

## 2021-07-26 ENCOUNTER — Other Ambulatory Visit: Payer: Self-pay | Admitting: Family Medicine

## 2021-11-29 ENCOUNTER — Other Ambulatory Visit: Payer: Self-pay | Admitting: Family Medicine

## 2021-11-29 DIAGNOSIS — R519 Headache, unspecified: Secondary | ICD-10-CM

## 2022-06-27 ENCOUNTER — Encounter: Payer: Self-pay | Admitting: Family Medicine

## 2022-06-27 ENCOUNTER — Ambulatory Visit (INDEPENDENT_AMBULATORY_CARE_PROVIDER_SITE_OTHER): Payer: 59 | Admitting: Family Medicine

## 2022-06-27 VITALS — BP 114/70 | HR 64 | Temp 97.7°F | Ht 73.62 in | Wt 202.2 lb

## 2022-06-27 DIAGNOSIS — Z0000 Encounter for general adult medical examination without abnormal findings: Secondary | ICD-10-CM | POA: Diagnosis not present

## 2022-06-27 DIAGNOSIS — R519 Headache, unspecified: Secondary | ICD-10-CM

## 2022-06-27 LAB — CBC WITH DIFFERENTIAL/PLATELET
Basophils Absolute: 0 10*3/uL (ref 0.0–0.1)
Basophils Relative: 0.5 % (ref 0.0–3.0)
Eosinophils Absolute: 0.5 10*3/uL (ref 0.0–0.7)
Eosinophils Relative: 7.1 % — ABNORMAL HIGH (ref 0.0–5.0)
HCT: 43.2 % (ref 39.0–52.0)
Hemoglobin: 14.1 g/dL (ref 13.0–17.0)
Lymphocytes Relative: 24.3 % (ref 12.0–46.0)
Lymphs Abs: 1.6 10*3/uL (ref 0.7–4.0)
MCHC: 32.7 g/dL (ref 30.0–36.0)
MCV: 85.8 fl (ref 78.0–100.0)
Monocytes Absolute: 0.8 10*3/uL (ref 0.1–1.0)
Monocytes Relative: 13 % — ABNORMAL HIGH (ref 3.0–12.0)
Neutro Abs: 3.6 10*3/uL (ref 1.4–7.7)
Neutrophils Relative %: 55.1 % (ref 43.0–77.0)
Platelets: 219 10*3/uL (ref 150.0–400.0)
RBC: 5.04 Mil/uL (ref 4.22–5.81)
RDW: 15.1 % (ref 11.5–15.5)
WBC: 6.5 10*3/uL (ref 4.0–10.5)

## 2022-06-27 LAB — HEPATIC FUNCTION PANEL
ALT: 12 U/L (ref 0–53)
AST: 18 U/L (ref 0–37)
Albumin: 3.8 g/dL (ref 3.5–5.2)
Alkaline Phosphatase: 40 U/L (ref 39–117)
Bilirubin, Direct: 0.1 mg/dL (ref 0.0–0.3)
Total Bilirubin: 0.6 mg/dL (ref 0.2–1.2)
Total Protein: 6.4 g/dL (ref 6.0–8.3)

## 2022-06-27 LAB — BASIC METABOLIC PANEL
BUN: 16 mg/dL (ref 6–23)
CO2: 30 mEq/L (ref 19–32)
Calcium: 9.1 mg/dL (ref 8.4–10.5)
Chloride: 102 mEq/L (ref 96–112)
Creatinine, Ser: 1.11 mg/dL (ref 0.40–1.50)
GFR: 72.94 mL/min (ref >60.00)
Glucose, Bld: 83 mg/dL (ref 70–99)
Potassium: 4.8 mEq/L (ref 3.5–5.1)
Sodium: 140 mEq/L (ref 135–145)

## 2022-06-27 LAB — LIPID PANEL
Cholesterol: 232 mg/dL — ABNORMAL HIGH (ref 0–200)
HDL: 57.8 mg/dL (ref >39.00)
LDL Cholesterol: 154 mg/dL — ABNORMAL HIGH (ref 0–99)
NonHDL: 173.84
Total CHOL/HDL Ratio: 4
Triglycerides: 101 mg/dL (ref 0.0–149.0)
VLDL: 20.2 mg/dL (ref 0.0–40.0)

## 2022-06-27 LAB — PSA: PSA: 0.5 ng/mL (ref 0.10–4.00)

## 2022-06-27 MED ORDER — SUMATRIPTAN SUCCINATE 100 MG PO TABS: ORAL_TABLET | ORAL | 3 refills | Status: DC

## 2022-06-27 NOTE — Progress Notes (Signed)
Established Patient Office Visit  Subjective   Patient ID: MARSON SALO, male    DOB: December 02, 1965  Age: 57 y.o. MRN: [id number]  Chief Complaint  Patient presents with   Annual Exam    HPI   Cody Arias is seen for physical exam.  He has history of migraine headaches and still has these periodically but generally relieved with Imitrex.  He tried amitriptyline previously for prophylaxis but does not seem to work very effectively.  He has established regular follow-up with dermatology and sees them yearly.  He has been more proactive in the past year with exercise and diet and has lost about 10 pounds.  Exercising regularly.  Health maintenance reviewed  -Prior hepatitis C screen negative -He completed Shingrix vaccine last fall.  Do not have dates -Colonoscopy due 2027 -Tetanus due 2030  Family history significant for mom passing away last year complications of Parkinson's disease.  She did have Parkinson's related dementia.  Father had prostate cancer.  Sister with rheumatoid arthritis.  Maternal grandmother with lupus.  No family history of premature CAD.  Social history-he is married and has 2 children.  He has 1 son who lives up in [place] and another who just graduated and is looking at colleges now.  No history of smoking.  The 10-year ASCVD risk score (Arnett DK, et al., 2019) is: 7.7%   Values used to calculate the score:     Age: 64 years     Sex: Male     Is Non-Hispanic African American: No     Diabetic: No     Tobacco smoker: No     Systolic Blood Pressure: 99991111 mmHg     Is BP treated: No     HDL Cholesterol: 56.6 mg/dL     Total Cholesterol: 261 mg/dL  Past Medical History:  Diagnosis Date   Allergy    hayfever   Anxiety    ASTHMA 08/26/2009   as child   Cancer [facility])    skin cancer    MIGRAINE HEADACHE 08/26/2009   Past Surgical History:  Procedure Laterality Date   COLONOSCOPY  07/15/2013   KNEE SURGERY Left Maniscus repair/ April 2017   peridontal   2009, 2010   POLYPECTOMY     SHOULDER ARTHROSCOPY W/ LABRAL REPAIR  2004   SKIN CANCER EXCISION     basil cell x 4 (chest,back,and leg)   VASECTOMY  2010    reports that he has never smoked. He has never used smokeless tobacco. He reports current alcohol use of about 3.0 standard drinks of alcohol per week. He reports that he does not use drugs. family history includes Arthritis in his sister; Cancer in his mother; Dementia in his mother; Kidney disease in his maternal grandmother; Lupus in his maternal grandfather; Parkinson's disease in his mother; Prostate cancer in his father and paternal grandfather. No Known Allergies   Review of Systems  Constitutional:  Negative for chills, fever, malaise/fatigue and weight loss.  HENT:  Negative for hearing loss.   Eyes:  Negative for blurred vision and double vision.  Respiratory:  Negative for cough and shortness of breath.   Cardiovascular:  Negative for chest pain, palpitations and leg swelling.  Gastrointestinal:  Negative for abdominal pain, blood in stool, constipation and diarrhea.  Genitourinary:  Negative for dysuria.  Skin:  Negative for rash.  Neurological:  Negative for dizziness, speech change, seizures and loss of consciousness.  Psychiatric/Behavioral:  Negative for depression.  Objective:     BP 114/70 (BP Location: Left Arm, Patient Position: Sitting, Cuff Size: Large)   Pulse 64   Temp 97.7 F (36.5 C) (Oral)   Ht 6' 1.62" (1.87 m)   Wt 202 lb 3.2 oz (91.7 kg)   SpO2 99%   BMI 26.23 kg/m  BP Readings from Last 3 Encounters:  06/27/22 114/70  03/27/21 120/68  02/22/21 (!) 120/58   Wt Readings from Last 3 Encounters:  06/27/22 202 lb 3.2 oz (91.7 kg)  03/27/21 210 lb 14.4 oz (95.7 kg)  02/22/21 204 lb 8 oz (92.8 kg)      Physical Exam Vitals reviewed.  Constitutional:      General: He is not in acute distress.    Appearance: He is well-developed.  HENT:     Head: Normocephalic and atraumatic.      Ears:     Comments: Minimal cerumen in both canals but nonobstructing Eyes:     Conjunctiva/sclera: Conjunctivae normal.     Pupils: Pupils are equal, round, and reactive to light.  Neck:     Thyroid: No thyromegaly.  Cardiovascular:     Rate and Rhythm: Normal rate and regular rhythm.     Heart sounds: Normal heart sounds. No murmur heard. Pulmonary:     Effort: No respiratory distress.     Breath sounds: No wheezing or rales.  Abdominal:     General: Bowel sounds are normal. There is no distension.     Palpations: Abdomen is soft. There is no mass.     Tenderness: There is no abdominal tenderness. There is no guarding or rebound.  Musculoskeletal:     Cervical back: Normal range of motion and neck supple.     Right lower leg: No edema.     Left lower leg: No edema.  Lymphadenopathy:     Cervical: No cervical adenopathy.  Skin:    Findings: No rash.  Neurological:     Mental Status: He is alert and oriented to person, place, and time.     Cranial Nerves: No cranial nerve deficit.      No results found for any visits on 06/27/22.    The 10-year ASCVD risk score (Arnett DK, et al., 2019) is: 7.7%    Assessment & Plan:   Physical exam-healthy 57 year old male.  History of migraine headaches which are stable.  Refilled Imitrex for 1 year.  We discussed the following health maintenance items  -Shingrix vaccine complete -Repeat colonoscopy due 2027 -Obtain screening labs including PSA with family history of prostate cancer -Continue regular exercise habits -Repeat tetanus 2030 -Recommend continued annual dermatology follow-up with his past history of skin cancer.  He had a couple of prior basal cell carcinomas.   No follow-ups on file.    Carolann Littler, MD

## 2023-03-25 ENCOUNTER — Ambulatory Visit (INDEPENDENT_AMBULATORY_CARE_PROVIDER_SITE_OTHER): Payer: 59 | Admitting: Family Medicine

## 2023-03-25 ENCOUNTER — Other Ambulatory Visit: Payer: Self-pay

## 2023-03-25 ENCOUNTER — Encounter: Payer: Self-pay | Admitting: Family Medicine

## 2023-03-25 VITALS — BP 122/84 | Ht 74.0 in | Wt 205.0 lb

## 2023-03-25 DIAGNOSIS — M7711 Lateral epicondylitis, right elbow: Secondary | ICD-10-CM | POA: Diagnosis not present

## 2023-03-25 DIAGNOSIS — M25521 Pain in right elbow: Secondary | ICD-10-CM

## 2023-03-25 NOTE — Progress Notes (Signed)
DATE OF VISIT: 03/25/2023        Cody Arias DOB: 11-23-1965 MRN: [id number]  CC:  Rt elbow pain  History- Cody Arias is a 57 y.o. RT-hand dominant male for evaluation and treatment of Rt elbow pain Referred by Dr Thereasa Distance, [place] for evaluation and consideration of elbow injection PMH significant for Rt shoulder arthroscopy with labral repair in the past with [facility] - seen by them 12/03/22 and underwent intra-articular GH injection - prior notes reviewed in CareEverywhere during visit today  Rt lateral elbow pain x 3 weeks Plays tennis a few days/wk - likes to play 2-3 days/wk Noted pain after doing exercises with a weighted ball to help with shoulder exercises Ok with tennis last week, but does have along the lateral elbow Working with chiropractor - had ART once - helped some No medications for this, but does take Excedrin prn Denies numbness/tingling No prior issues with elbow   Past Medical History Past Medical History:  Diagnosis Date   Allergy    hayfever   Anxiety    ASTHMA 08/26/2009   as child   Cancer (HCC)    skin cancer    MIGRAINE HEADACHE 08/26/2009    Past Surgical History Past Surgical History:  Procedure Laterality Date   COLONOSCOPY  07/15/2013   KNEE SURGERY Left Maniscus repair/ April 2017   peridontal  2009, 2010   POLYPECTOMY     SHOULDER ARTHROSCOPY W/ LABRAL REPAIR  2004   SKIN CANCER EXCISION     basil cell x 4 (chest,back,and leg)   VASECTOMY  2010    Medications Current Outpatient Medications  Medication Sig Dispense Refill   aspirin-acetaminophen-caffeine (EXCEDRIN MIGRAINE) 250-250-65 MG tablet Take by mouth every 6 (six) hours as needed for headache.     SUMAtriptan (IMITREX) 100 MG tablet May repeat in 2 hours if headache persists or recurs. 9 tablet 3   No current facility-administered medications for this visit.    Allergies has No Known Allergies.  Family History - reviewed per EMR and intake form  Social  History   reports current alcohol use of about 3.0 standard drinks of alcohol per week.  reports that he has never smoked. He has never used smokeless tobacco.  reports no history of drug use. 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  Tender to palpation over the lateral epicondyle and over the common extensor tendon.  No tenderness over the medial elbow over the olecranon.  Negative Tinel's over the cubital tunnel.  Increased pain with resisted wrist extension and resisted third finger extension.  No pain with resisted wrist flexion.  He has normal grip strength. Left elbow with full range of motion without pain, weakness, instability.  No tenderness over lateral epicondyle.  No pain with resisted wrist extension or resisted third finger extension  NEURO: sensation intact to light touch, DTR + 2/4 bicep, tricep, brachioradialis bilaterally VASC: pulses 2+ and symmetric radial artery bilaterally, no edema  IMAGING: Limited MSK ultrasound right lateral elbow Date: 03/25/2023 Indication: Right elbow pain rule out common extensor tear Findings: -Partial tear of the common extensor tendon at attachment on the lateral epicondyle, associated thickening and hypoechoic change of the surrounding tendon.  Increased Doppler flow and hyperemia also noted.  Well-visualized in short axis and long axis views -Normal radiocapitellar joint.  No effusion  Impression: -Partial tear of common extensor tendon of the right elbow  Images and interpretation completed by Darene Lamer, DO today  Assessment & Plan Lateral epicondylitis, right elbow Acute right lateral elbow pain x 3 weeks with MSK ultrasound showing partial tear of the common extensor tendon  Plan: -Imaging findings  and diagnosis and treatment reviewed in detail -Patient inquiring about cortisone injection, advised against that at this time given signs of tear and increased risk of tendon rupture.  Patient expressed understanding -Recommend over-the-counter Voltaren gel applied to the area every 6-8 hours as needed -Fitted with counterforce brace to wear with activity and during the day as needed.  Does not need to wear at night while sleeping -Instructed on home exercise program.  Should do this on a daily basis, can perform up to twice a day if able -Okay to continue using TENS unit as needed -Patient not candidate for topical nitroglycerin therapy due to history of migraine headaches -Discussed possibility of shockwave therapy versus PRP in the future if he is not improving -Follow-up 6 weeks for reevaluation, sooner as needed   Patient expressed understanding & agreement with above.  Encounter Diagnosis  Name Primary?   Lateral epicondylitis, right elbow Yes    Orders Placed This Encounter  Procedures   [place] LIMITED JOINT SPACE STRUCTURES UP RIGHT    Orders Placed This Encounter  Procedures   [place] LIMITED JOINT SPACE STRUCTURES UP RIGHT

## 2023-03-25 NOTE — Patient Instructions (Signed)
You have lateral epicondylitis (tennis elbow) with a partial tear of the tendon at your elbow that we saw on ultrasound today - Try to avoid painful activities as much as possible.  I recommend resting from tennis for at least 2 weeks, but you may need longer - Ice the area 3-4 times a day for 15 minutes at a time as needed -You would benefit from the use of Voltaren gel every 6-hrs as needed.  This is a topical anti-inflammatory medication to help with pain and inflammation/swelling.  You can purchase this at your local pharmacy over-the-counter. - You can continue your Excedrin as needed - Counterforce brace as directed can help unload area - wear this regularly if it provides you with relief. - Do home exercises as directed. - Consider formal physical therapy, shockwave therapy, or PRP (platelet rich plasma) if not improving. Follow up in 6 weeks or sooner as needed  Have a safe and happy holiday!

## 2023-05-06 ENCOUNTER — Encounter: Payer: Self-pay | Admitting: Family Medicine

## 2023-05-06 ENCOUNTER — Ambulatory Visit (INDEPENDENT_AMBULATORY_CARE_PROVIDER_SITE_OTHER): Payer: 59 | Admitting: Family Medicine

## 2023-05-06 VITALS — BP 118/84 | Ht 74.0 in | Wt 210.0 lb

## 2023-05-06 DIAGNOSIS — M7711 Lateral epicondylitis, right elbow: Secondary | ICD-10-CM | POA: Diagnosis not present

## 2023-05-06 MED ORDER — METHYLPREDNISOLONE ACETATE 40 MG/ML IJ SUSP
40.0000 mg | Freq: Once | INTRAMUSCULAR | Status: AC
  Administered 2023-05-06: 40 mg via INTRA_ARTICULAR

## 2023-05-06 NOTE — Progress Notes (Signed)
DATE OF VISIT: 05/06/2023        Cody Arias DOB: [birth year] MRN: [id number]  CC:  f/u right tennis elbow  History of present Illness: Cody Arias is a 58 y.o. male who presents for a follow-up visit for Rt tennis elbow Last seen 03/25/23 - U/S showed partial tear of common ext tendon - given counterforce brace - instructed on HEP - not candidate for topical NTG due to hx of migraine HA Since last visit feels like getting worse Now pain along medial elbow Dropped a plate and broke it this morning   Medications:  Outpatient Encounter Medications as of 05/06/2023  Medication Sig   aspirin-acetaminophen-caffeine (EXCEDRIN MIGRAINE) 250-250-65 MG tablet Take by mouth every 6 (six) hours as needed for headache.   SUMAtriptan (IMITREX) 100 MG tablet May repeat in 2 hours if headache persists or recurs.   [EXPIRED] methylPREDNISolone acetate (DEPO-MEDROL) injection 40 mg    No facility-administered encounter medications on file as of 05/06/2023.    Allergies: has no known allergies.  Physical Examination: Vitals: BP 118/84   Ht 6\' 2"  (1.88 m)   Wt 210 lb (95.3 kg)   BMI 26.96 kg/m  GENERAL:  Cody Arias is a 58 y.o. male appearing their stated age, alert and oriented x 3, in no apparent distress.  SKIN: no rashes or lesions, skin clean, dry, intact MSK:  Right elbow without swelling or effusion.  Tender to palpation over the lateral epicondyle and over the common extensor tendon.  Mild tenderness over the medial elbow and along distal bicep.  No TTP over the olecranon.  Increased pain with resisted wrist extension and resisted third finger extension.  No pain with resisted wrist flexion.  He has normal grip strength. Left elbow with full range of motion without pain, weakness, instability.  No tenderness over lateral epicondyle.  No pain with resisted wrist extension or resisted third finger extension NEURO: sensation intact to light touch upper ext bilaterally VASC: pulses 2+ and  symmetric radial artery bilaterally, no edema   Assessment & Plan Lateral epicondylitis, right elbow Worsening acute Rt lateral elbow pain, no improvement since last visit 03/25/23.  Now with mild anterior and medial pain, likely related to compenstation - MSK U/S last visit showing partial tear of common ext tendon  PLAN: -To worsening symptoms patient is candidate for cortisone injection today.  He would like to proceed.  This was completed today and he tolerated well.  PROCEDURE:  Risks & benefits of RT lateral epicondyle injection reviewed.  Consent obtained.  Time-out completed.  Patient prepped and draped in the normal fashion.  Area cleansed with alcohol.  Ethyl chloride spray used to anesthetize the skin.  Solution of 2 mL 1% lidocaine with 1 mL methylprednisolone (Depo-medrol) 40mg /mL injected into the RT lateral elbow at lateral epicondyle over point of maximal tenderness using a 25-gauge 1.5-inch needle and injected in a fan-like pattern.  Patient tolerated procedure well without any complications.  Area covered with adhesive bandage.  Post-procedure care reviewed.   -Should continue use counterforce brace as he is doing -Should continue home exercise program and treatments with chiropractor Dr. Thereasa Distance as he is doing, including dry needling -Follow-up with me in 4 to 6 weeks for reevaluation, sooner as needed.  If no improvement at that time would consider merits of advanced imaging with MRI versus trial of shockwave therapy or PRP.  He is aware of potential out-of-pocket cost for shockwave and PRP treatments.   Patient  expressed understanding & agreement with above.  Encounter Diagnosis  Name Primary?   Lateral epicondylitis, right elbow Yes    No orders of the defined types were placed in this encounter.

## 2023-05-06 NOTE — Patient Instructions (Signed)

## 2023-06-10 ENCOUNTER — Ambulatory Visit: Payer: 59 | Admitting: Family Medicine

## 2023-06-25 ENCOUNTER — Other Ambulatory Visit: Payer: Self-pay | Admitting: Family Medicine

## 2023-06-25 DIAGNOSIS — R519 Headache, unspecified: Secondary | ICD-10-CM

## 2023-06-28 ENCOUNTER — Ambulatory Visit: Payer: 59 | Admitting: Family Medicine

## 2023-06-28 VITALS — BP 124/74 | HR 66 | Temp 97.7°F | Ht 74.02 in | Wt 211.6 lb

## 2023-06-28 DIAGNOSIS — Z0000 Encounter for general adult medical examination without abnormal findings: Secondary | ICD-10-CM

## 2023-06-28 DIAGNOSIS — Z23 Encounter for immunization: Secondary | ICD-10-CM | POA: Diagnosis not present

## 2023-06-28 LAB — HEPATIC FUNCTION PANEL
ALT: 14 U/L (ref 0–53)
AST: 18 U/L (ref 0–37)
Albumin: 4.1 g/dL (ref 3.5–5.2)
Alkaline Phosphatase: 48 U/L (ref 39–117)
Bilirubin, Direct: 0.1 mg/dL (ref 0.0–0.3)
Total Bilirubin: 0.6 mg/dL (ref 0.2–1.2)
Total Protein: 6.8 g/dL (ref 6.0–8.3)

## 2023-06-28 LAB — CBC WITH DIFFERENTIAL/PLATELET
Basophils Absolute: 0 10*3/uL (ref 0.0–0.1)
Basophils Relative: 0.7 % (ref 0.0–3.0)
Eosinophils Absolute: 0.3 10*3/uL (ref 0.0–0.7)
Eosinophils Relative: 4.6 % (ref 0.0–5.0)
HCT: 43.6 % (ref 39.0–52.0)
Hemoglobin: 14.5 g/dL (ref 13.0–17.0)
Lymphocytes Relative: 25.4 % (ref 12.0–46.0)
Lymphs Abs: 1.4 10*3/uL (ref 0.7–4.0)
MCHC: 33.2 g/dL (ref 30.0–36.0)
MCV: 88.2 fl (ref 78.0–100.0)
Monocytes Absolute: 0.8 10*3/uL (ref 0.1–1.0)
Monocytes Relative: 13.5 % — ABNORMAL HIGH (ref 3.0–12.0)
Neutro Abs: 3.1 10*3/uL (ref 1.4–7.7)
Neutrophils Relative %: 55.8 % (ref 43.0–77.0)
Platelets: 224 10*3/uL (ref 150.0–400.0)
RBC: 4.95 Mil/uL (ref 4.22–5.81)
RDW: 15.1 % (ref 11.5–15.5)
WBC: 5.6 10*3/uL (ref 4.0–10.5)

## 2023-06-28 LAB — LIPID PANEL
Cholesterol: 223 mg/dL — ABNORMAL HIGH (ref 0–200)
HDL: 50 mg/dL (ref >39.00)
LDL Cholesterol: 149 mg/dL — ABNORMAL HIGH (ref 0–99)
NonHDL: 172.98
Total CHOL/HDL Ratio: 4
Triglycerides: 118 mg/dL (ref 0.0–149.0)
VLDL: 23.6 mg/dL (ref 0.0–40.0)

## 2023-06-28 LAB — BASIC METABOLIC PANEL
BUN: 16 mg/dL (ref 6–23)
CO2: 30 meq/L (ref 19–32)
Calcium: 8.9 mg/dL (ref 8.4–10.5)
Chloride: 102 meq/L (ref 96–112)
Creatinine, Ser: 1.07 mg/dL (ref 0.40–1.50)
GFR: 75.69 mL/min (ref >60.00)
Glucose, Bld: 85 mg/dL (ref 70–99)
Potassium: 4.5 meq/L (ref 3.5–5.1)
Sodium: 139 meq/L (ref 135–145)

## 2023-06-28 LAB — PSA: PSA: 0.32 ng/mL (ref 0.10–4.00)

## 2023-06-28 MED ORDER — NORTRIPTYLINE HCL 10 MG PO CAPS: 10.0000 mg | ORAL_CAPSULE | Freq: Every day | ORAL | 5 refills | Status: DC

## 2023-06-28 NOTE — Progress Notes (Signed)
 Established Patient Office Visit  Subjective   Patient ID: Cody Arias, male    DOB: 1966/02/15  Age: 58 y.o. MRN: [id number]  Chief Complaint  Patient presents with   Annual Exam    HPI    Cody Arias is seen for physical exam.  Generally doing well.  He and his wife will be moving to [place] soon.  They are plan to downsize somewhat.  They still have a home in [facility].  He will eventually be establishing new care in [place].  He has history of alopecia and has seen dermatologist and was recently started on the finasteride.  He is not sure regarding dosage.  He has history of migraine headaches and takes Imitrex as needed.  He thinks he may be having some cervicogenic headaches.  He tends to have frequent neck pain on the right side and has headaches about 3 days/week at least.  Usually relieved with Excedrin.  Would like to explore possible preventatives.  Very health-conscious.  He exercises regularly.  Health maintenance reviewed:  Health Maintenance  Topic Date Due   Pneumococcal Vaccine 47-63 Years old (1 of 2 - PCV) Never done   HIV Screening  Never done   Zoster Vaccines- Shingrix (1 of 2) Never done   Colonoscopy  11/05/2025   DTaP/Tdap/Td (3 - Td or Tdap) 06/11/2028   COVID-19 Vaccine  Completed   Hepatitis C Screening  Completed   HPV VACCINES  Aged Out   INFLUENZA VACCINE  Discontinued   -We did discuss newer pneumonia vaccine guidelines and he would like to proceed with that. -He has had Shingrix vaccine though he did not have dates -Colonoscopy up-to-date and due 2027 -He does see dermatologist yearly for skin cancer screening  Family history significant for mom passing away last year complications of Parkinson's disease. She did have Parkinson's related dementia. Father had prostate cancer. Sister with rheumatoid arthritis. Maternal grandmother with lupus. No family history of premature CAD.   Social history-married with 2 children.  1 son is in [place]  and the other son attends [employer] in [place].  Cody Arias has never smoked.  Works from home.  Some travel with his job.  Usually only 2-3 alcoholic beverages per week  Past Medical History:  Diagnosis Date   Allergy    hayfever   Anxiety    ASTHMA 08/26/2009   as child   Cancer [facility])    skin cancer    MIGRAINE HEADACHE 08/26/2009   Past Surgical History:  Procedure Laterality Date   COLONOSCOPY  07/15/2013   KNEE SURGERY Left Maniscus repair/ April 2017   peridontal  2009, 2010   POLYPECTOMY     SHOULDER ARTHROSCOPY W/ LABRAL REPAIR  2004   SKIN CANCER EXCISION     basil cell x 4 (chest,back,and leg)   VASECTOMY  2010    reports that he has never smoked. He has never used smokeless tobacco. He reports current alcohol use of about 3.0 standard drinks of alcohol per week. He reports that he does not use drugs. family history includes Arthritis in his sister; Cancer in his mother; Dementia in his mother; Kidney disease in his maternal grandmother; Lupus in his maternal grandfather; Parkinson's disease in his mother; Prostate cancer in his father and paternal grandfather. No Known Allergies  Review of Systems  Constitutional:  Negative for chills, fever, malaise/fatigue and weight loss.  HENT:  Negative for hearing loss.   Eyes:  Negative for blurred vision and double  vision.  Respiratory:  Negative for cough and shortness of breath.   Cardiovascular:  Negative for chest pain, palpitations and leg swelling.  Gastrointestinal:  Negative for abdominal pain, blood in stool, constipation and diarrhea.  Genitourinary:  Negative for dysuria.  Skin:  Negative for rash.  Neurological:  Negative for dizziness, speech change, seizures, loss of consciousness and headaches.  Psychiatric/Behavioral:  Negative for depression.       Objective:     BP 124/74 (BP Location: Left Arm, Patient Position: Sitting, Cuff Size: Normal)   Pulse 66   Temp 97.7 F (36.5 C) (Oral)   Ht 6'  2.02" (1.88 m)   Wt 211 lb 9.6 oz (96 kg)   SpO2 99%   BMI 27.16 kg/m  BP Readings from Last 3 Encounters:  06/28/23 124/74  05/06/23 118/84  03/25/23 122/84   Wt Readings from Last 3 Encounters:  06/28/23 211 lb 9.6 oz (96 kg)  05/06/23 210 lb (95.3 kg)  03/25/23 205 lb (93 kg)      Physical Exam Constitutional:      General: He is not in acute distress.    Appearance: He is well-developed.  HENT:     Head: Normocephalic and atraumatic.     Right Ear: External ear normal.     Left Ear: External ear normal.  Eyes:     Conjunctiva/sclera: Conjunctivae normal.     Pupils: Pupils are equal, round, and reactive to light.  Neck:     Thyroid: No thyromegaly.  Cardiovascular:     Rate and Rhythm: Normal rate and regular rhythm.     Heart sounds: Normal heart sounds. No murmur heard. Pulmonary:     Effort: No respiratory distress.     Breath sounds: No wheezing or rales.  Abdominal:     General: Bowel sounds are normal. There is no distension.     Palpations: Abdomen is soft. There is no mass.     Tenderness: There is no abdominal tenderness. There is no guarding or rebound.  Musculoskeletal:     Cervical back: Normal range of motion and neck supple.  Lymphadenopathy:     Cervical: No cervical adenopathy.  Skin:    Findings: No rash.  Neurological:     Mental Status: He is alert and oriented to person, place, and time.     Cranial Nerves: No cranial nerve deficit.      No results found for any visits on 06/28/23.    The 10-year ASCVD risk score (Arnett DK, et al., 2019) is: 8.4%    Assessment & Plan:   Problem List Items Addressed This Visit   None Visit Diagnoses       Physical exam    -  Primary   Relevant Orders   Basic metabolic panel   Lipid panel   CBC with Differential/Platelet   Hepatic function panel   PSA     Healthy 58 year old male.  History of migraine headaches.  He had some recent headaches which are several days per week which sound  like they may be more cervicogenic.  Possible tension component.  We did discuss possible trial of prophylaxis with nortriptyline 10 mg nightly.  -Obtain follow-up labs as above -Would continue annual flu vaccine -Prevnar 20 given -Due for repeat colonoscopy 2027 -Continue regular exercise habits  No follow-ups on file.    Evelena Peat, MD

## 2023-07-23 ENCOUNTER — Other Ambulatory Visit: Payer: Self-pay | Admitting: Family Medicine

## 2023-08-13 ENCOUNTER — Other Ambulatory Visit: Payer: Self-pay | Admitting: Family Medicine

## 2023-08-13 DIAGNOSIS — R519 Headache, unspecified: Secondary | ICD-10-CM

## 2023-09-17 ENCOUNTER — Other Ambulatory Visit: Payer: Self-pay | Admitting: Family Medicine

## 2023-09-17 DIAGNOSIS — R519 Headache, unspecified: Secondary | ICD-10-CM

## 2023-09-23 ENCOUNTER — Encounter: Payer: Self-pay | Admitting: Family Medicine

## 2023-11-25 ENCOUNTER — Other Ambulatory Visit: Payer: Self-pay | Admitting: Family Medicine

## 2023-11-25 DIAGNOSIS — R519 Headache, unspecified: Secondary | ICD-10-CM

## 2023-12-26 ENCOUNTER — Other Ambulatory Visit: Payer: Self-pay | Admitting: Family Medicine

## 2023-12-26 DIAGNOSIS — R519 Headache, unspecified: Secondary | ICD-10-CM
